# Patient Record
Sex: Male | Born: 1965 | Race: Black or African American | Hispanic: No | Marital: Single | Smoking: Current every day smoker
Health system: Southern US, Community
[De-identification: ages and names within clinical notes are randomized; demographics above are authoritative.]

## PROBLEM LIST (undated history)

## (undated) DIAGNOSIS — G8929 Other chronic pain: Secondary | ICD-10-CM

## (undated) DIAGNOSIS — J45909 Unspecified asthma, uncomplicated: Secondary | ICD-10-CM

## (undated) DIAGNOSIS — M549 Dorsalgia, unspecified: Secondary | ICD-10-CM

## (undated) DIAGNOSIS — I10 Essential (primary) hypertension: Secondary | ICD-10-CM

## (undated) DIAGNOSIS — T7840XA Allergy, unspecified, initial encounter: Secondary | ICD-10-CM

## (undated) HISTORY — PX: HAND SURGERY: SHX662

## (undated) HISTORY — DX: Unspecified asthma, uncomplicated: J45.909

## (undated) HISTORY — DX: Allergy, unspecified, initial encounter: T78.40XA

---

## 2001-02-20 ENCOUNTER — Emergency Department (HOSPITAL_COMMUNITY): Admission: EM | Admit: 2001-02-20 | Discharge: 2001-02-20 | Payer: Self-pay | Admitting: *Deleted

## 2001-04-23 ENCOUNTER — Emergency Department (HOSPITAL_COMMUNITY): Admission: EM | Admit: 2001-04-23 | Discharge: 2001-04-23 | Payer: Self-pay | Admitting: Emergency Medicine

## 2001-04-23 ENCOUNTER — Encounter: Payer: Self-pay | Admitting: Emergency Medicine

## 2001-05-23 ENCOUNTER — Emergency Department (HOSPITAL_COMMUNITY): Admission: EM | Admit: 2001-05-23 | Discharge: 2001-05-23 | Payer: Self-pay | Admitting: Emergency Medicine

## 2001-05-23 ENCOUNTER — Encounter: Payer: Self-pay | Admitting: Emergency Medicine

## 2003-11-17 ENCOUNTER — Emergency Department (HOSPITAL_COMMUNITY): Admission: EM | Admit: 2003-11-17 | Discharge: 2003-11-17 | Payer: Self-pay | Admitting: Emergency Medicine

## 2005-03-23 ENCOUNTER — Emergency Department (HOSPITAL_COMMUNITY): Admission: EM | Admit: 2005-03-23 | Discharge: 2005-03-23 | Payer: Self-pay | Admitting: Emergency Medicine

## 2007-03-03 ENCOUNTER — Emergency Department (HOSPITAL_COMMUNITY): Admission: EM | Admit: 2007-03-03 | Discharge: 2007-03-03 | Payer: Self-pay | Admitting: Emergency Medicine

## 2007-04-02 ENCOUNTER — Emergency Department (HOSPITAL_COMMUNITY): Admission: EM | Admit: 2007-04-02 | Discharge: 2007-04-02 | Payer: Self-pay | Admitting: Emergency Medicine

## 2007-08-31 ENCOUNTER — Emergency Department (HOSPITAL_COMMUNITY): Admission: EM | Admit: 2007-08-31 | Discharge: 2007-08-31 | Payer: Self-pay | Admitting: Emergency Medicine

## 2010-08-13 ENCOUNTER — Emergency Department (HOSPITAL_COMMUNITY)
Admission: EM | Admit: 2010-08-13 | Discharge: 2010-08-13 | Payer: Self-pay | Source: Home / Self Care | Admitting: Emergency Medicine

## 2010-11-16 ENCOUNTER — Emergency Department (HOSPITAL_COMMUNITY)
Admission: EM | Admit: 2010-11-16 | Discharge: 2010-11-16 | Disposition: A | Discharge status: Home / Self Care | Payer: Self-pay | Attending: Emergency Medicine | Admitting: Emergency Medicine

## 2010-11-16 DIAGNOSIS — I10 Essential (primary) hypertension: Secondary | ICD-10-CM | POA: Insufficient documentation

## 2010-11-16 DIAGNOSIS — M549 Dorsalgia, unspecified: Secondary | ICD-10-CM | POA: Insufficient documentation

## 2010-11-16 DIAGNOSIS — Z79899 Other long term (current) drug therapy: Secondary | ICD-10-CM | POA: Insufficient documentation

## 2010-11-16 DIAGNOSIS — M5380 Other specified dorsopathies, site unspecified: Secondary | ICD-10-CM | POA: Insufficient documentation

## 2010-11-16 DIAGNOSIS — G8929 Other chronic pain: Secondary | ICD-10-CM | POA: Insufficient documentation

## 2010-11-16 LAB — URINALYSIS, ROUTINE W REFLEX MICROSCOPIC
Bilirubin Urine: NEGATIVE
Glucose, UA: NEGATIVE mg/dL
Hgb urine dipstick: NEGATIVE
Ketones, ur: NEGATIVE mg/dL
Nitrite: NEGATIVE
Protein, ur: NEGATIVE mg/dL
Specific Gravity, Urine: 1.025 (ref 1.005–1.030)
Urobilinogen, UA: 0.2 mg/dL (ref 0.0–1.0)
pH: 5.5 (ref 5.0–8.0)

## 2011-06-09 ENCOUNTER — Emergency Department (HOSPITAL_COMMUNITY): Payer: Self-pay

## 2011-06-09 ENCOUNTER — Emergency Department (HOSPITAL_COMMUNITY)
Admission: EM | Admit: 2011-06-09 | Discharge: 2011-06-09 | Disposition: A | Discharge status: Home / Self Care | Payer: Self-pay | Attending: Emergency Medicine | Admitting: Emergency Medicine

## 2011-06-09 ENCOUNTER — Encounter: Payer: Self-pay | Admitting: Emergency Medicine

## 2011-06-09 DIAGNOSIS — F17200 Nicotine dependence, unspecified, uncomplicated: Secondary | ICD-10-CM | POA: Insufficient documentation

## 2011-06-09 DIAGNOSIS — R109 Unspecified abdominal pain: Secondary | ICD-10-CM | POA: Insufficient documentation

## 2011-06-09 DIAGNOSIS — R1030 Lower abdominal pain, unspecified: Secondary | ICD-10-CM

## 2011-06-09 DIAGNOSIS — I10 Essential (primary) hypertension: Secondary | ICD-10-CM | POA: Insufficient documentation

## 2011-06-09 DIAGNOSIS — R062 Wheezing: Secondary | ICD-10-CM | POA: Insufficient documentation

## 2011-06-09 HISTORY — DX: Essential (primary) hypertension: I10

## 2011-06-09 LAB — URINALYSIS, ROUTINE W REFLEX MICROSCOPIC
Bilirubin Urine: NEGATIVE
Glucose, UA: NEGATIVE mg/dL
Hgb urine dipstick: NEGATIVE
Ketones, ur: NEGATIVE mg/dL
Leukocytes, UA: NEGATIVE
Nitrite: NEGATIVE
Protein, ur: NEGATIVE mg/dL
Specific Gravity, Urine: <1.005 — ABNORMAL LOW (ref 1.005–1.030)
Urobilinogen, UA: 0.2 mg/dL (ref 0.0–1.0)
pH: 6 (ref 5.0–8.0)

## 2011-06-09 MED ORDER — NAPROXEN 500 MG PO TABS: 500.0000 mg | ORAL_TABLET | Freq: Two times a day (BID) | ORAL | Status: DC

## 2011-06-09 MED ORDER — OXYCODONE-ACETAMINOPHEN 5-325 MG PO TABS
1.0000 | ORAL_TABLET | Freq: Once | ORAL | Status: AC
  Administered 2011-06-09: 1 via ORAL
  Filled 2011-06-09: qty 1

## 2011-06-09 MED ORDER — HYDROCODONE-ACETAMINOPHEN 5-325 MG PO TABS: 1.0000 | ORAL_TABLET | Freq: Four times a day (QID) | ORAL | Status: AC | PRN

## 2011-06-09 NOTE — ED Notes (Signed)
Pt c/o left flank pain x 3 days 

## 2011-06-09 NOTE — ED Notes (Signed)
Pt reporting pain beginning in left lower back and radiating to left groin.  Denies nausea, vomiting, fever or any other complaints.  No acute distress noted.

## 2011-06-09 NOTE — ED Provider Notes (Signed)
History    Scribed for Benny Lennert, MD, the patient was seen in room APA11/APA11. This chart was scribed by Katha Cabal. This patient's care was started at 18:47.   CSN: [id number] Arrival date & time: 06/09/2011  6:04 PM  Chief Complaint  Patient presents with  . Flank Pain    (Consider location/radiation/quality/duration/timing/severity/associated sxs/prior treatment) HPI  Mike Keller is a 45 y.o. male who presents to the Emergency Department complaining of gradual worsening of left flank pain with associated left groin pain and ongoing back pain that began 3 days ago.  Denies n/v/d, fever, chills, injury, dysuria, and smoking.  Pain is worsened by movement and standing upright.  Patient frequently lifts heavy objects at work.  Patient has hx of back pain and HTN.     PAST MEDICAL HISTORY:  Past Medical History  Diagnosis Date  . Hypertension     PAST SURGICAL HISTORY:  History reviewed. No pertinent past surgical history.  FAMILY HISTORY:  History reviewed. No pertinent family history.   SOCIAL HISTORY: History   Social History  . Marital Status: Single    Spouse Name: N/A    Number of Children: N/A  . Years of Education: N/A   Social History Main Topics  . Smoking status: Current Everyday Smoker    Types: Cigarettes  . Smokeless tobacco: None  . Alcohol Use: Yes  . Drug Use: No  . Sexually Active:    Other Topics Concern  . None   Social History Narrative  . None    Review of Systems  Constitutional: Negative for fever, chills and fatigue.  HENT: Negative for congestion, sinus pressure and ear discharge.   Eyes: Negative for discharge.  Respiratory: Negative for cough.   Cardiovascular: Negative for chest pain.  Gastrointestinal: Negative for nausea, vomiting, abdominal pain and diarrhea.  Genitourinary: Positive for flank pain (left). Negative for dysuria, frequency and hematuria.  Musculoskeletal: Positive for back pain.  Skin: Negative  for rash.  Neurological: Negative for seizures and headaches.  Hematological: Negative.   Psychiatric/Behavioral: Negative for hallucinations.    Allergies  Review of patient's allergies indicates no known allergies.  Home Medications   Current Outpatient Rx  Name Route Sig Dispense Refill  . IBUPROFEN 200 MG PO TABS Oral Take 400 mg by mouth as needed. For pain       BP 152/103  Pulse 73  Temp(Src) 99.4 F (37.4 C) (Oral)  Resp 16  Ht 5\' 7"  (1.702 m)  Wt 145 lb (65.772 kg)  BMI 22.71 kg/m2  SpO2 99%  Physical Exam  Constitutional: He is oriented to person, place, and time. He appears well-developed. No distress.  HENT:  Head: Normocephalic and atraumatic.  Eyes: Conjunctivae and EOM are normal. No scleral icterus.  Neck: Neck supple. No thyromegaly present.  Cardiovascular: Normal rate and regular rhythm.  Exam reveals no gallop and no friction rub.   No murmur heard. Pulmonary/Chest: Effort normal. No stridor. He has wheezes. He has no rales. He exhibits no tenderness.       Minimal wheezes  Abdominal: Soft. He exhibits no distension and no mass. There is no rebound.       Left inguinal tenderness   Musculoskeletal: Normal range of motion. He exhibits no edema.  Lymphadenopathy:    He has no cervical adenopathy.  Neurological: He is oriented to person, place, and time. Coordination normal.  Skin: No rash noted. No erythema.  Psychiatric: He has a normal mood and affect. His  behavior is normal.    ED Course  Procedures (including critical care time)  Labs Reviewed  URINALYSIS, ROUTINE W REFLEX MICROSCOPIC - Abnormal; Notable for the following:    Specific Gravity, Urine <1.005 (*)    All other components within normal limits   OTHER DATA REVIEWED: Nursing notes, vital signs, and past medical records reviewed.   DIAGNOSTIC STUDIES: Oxygen Saturation is 99% on room air, normal by my interpretation.       LABS / RADIOLOGY:  Results for orders placed  during the hospital encounter of 06/09/11  URINALYSIS, ROUTINE W REFLEX MICROSCOPIC      Component Value Range   Color, Urine YELLOW  YELLOW    Appearance CLEAR  CLEAR    Specific Gravity, Urine <1.005 (*) 1.005 - 1.030    pH 6.0  5.0 - 8.0    Glucose, UA NEGATIVE  NEGATIVE (mg/dL)   Hgb urine dipstick NEGATIVE  NEGATIVE    Bilirubin Urine NEGATIVE  NEGATIVE    Ketones, ur NEGATIVE  NEGATIVE (mg/dL)   Protein, ur NEGATIVE  NEGATIVE (mg/dL)   Urobilinogen, UA 0.2  0.0 - 1.0 (mg/dL)   Nitrite NEGATIVE  NEGATIVE    Leukocytes, UA NEGATIVE  NEGATIVE    Dg Hip Complete Left  06/09/2011  *RADIOLOGY REPORT*  Clinical Data: Left hip pain for 4 days.  LEFT HIP - COMPLETE 2+ VIEW  Comparison: None.  Findings: Hip joint spaces are preserved.  The right hip is externally rotated.  Pelvic rings appear intact.  There is no fracture.  No AVN.  SI joints appear within normal limits.  IMPRESSION: Negative.  Original Report Authenticated By: Andreas Newport, M.D.       ED COURSE / COORDINATION OF CARE: 6:56 PM  Physical exam complete.  Will XR left hip.   8:26 PM  XR normal plan to discharge patient home.     Orders Placed This Encounter  Procedures  . DG Hip Complete Left  . Urinalysis, Routine w reflex microscopic         MDM   Groin pain,  2nd to inflamed quad insertion   IMPRESSION: Diagnoses that have been ruled out:  Diagnoses that are still under consideration:  Final diagnoses:     MEDICATIONS GIVEN IN THE E.D. Scheduled Meds:    . oxyCODONE-acetaminophen  1 tablet Oral Once   Continuous Infusions:     DISCHARGE MEDICATIONS: New Prescriptions   No medications on file     The chart was scribed for me under my direct supervision.  I personally performed the history, physical, and medical decision making and all procedures in the evaluation of this patient.Benny Lennert, MD 06/09/11 2036

## 2011-11-03 ENCOUNTER — Emergency Department (HOSPITAL_COMMUNITY)
Admission: EM | Admit: 2011-11-03 | Discharge: 2011-11-03 | Disposition: A | Discharge status: Home / Self Care | Payer: Self-pay | Attending: Emergency Medicine | Admitting: Emergency Medicine

## 2011-11-03 ENCOUNTER — Encounter (HOSPITAL_COMMUNITY): Payer: Self-pay | Admitting: *Deleted

## 2011-11-03 DIAGNOSIS — H5710 Ocular pain, unspecified eye: Secondary | ICD-10-CM | POA: Insufficient documentation

## 2011-11-03 DIAGNOSIS — W208XXA Other cause of strike by thrown, projected or falling object, initial encounter: Secondary | ICD-10-CM | POA: Insufficient documentation

## 2011-11-03 DIAGNOSIS — T1590XA Foreign body on external eye, part unspecified, unspecified eye, initial encounter: Secondary | ICD-10-CM | POA: Insufficient documentation

## 2011-11-03 MED ORDER — FLUORESCEIN SODIUM 1 MG OP STRP
ORAL_STRIP | OPHTHALMIC | Status: AC
  Administered 2011-11-03: 15:00:00
  Filled 2011-11-03: qty 1

## 2011-11-03 MED ORDER — TOBRAMYCIN 0.3 % OP OINT
TOPICAL_OINTMENT | Freq: Once | OPHTHALMIC | Status: AC
  Administered 2011-11-03: 16:00:00 via OPHTHALMIC
  Filled 2011-11-03: qty 3.5

## 2011-11-03 MED ORDER — TETRACAINE HCL 0.5 % OP SOLN
2.0000 [drp] | Freq: Once | OPHTHALMIC | Status: AC
  Administered 2011-11-03: 2 [drp] via OPHTHALMIC
  Filled 2011-11-03: qty 2

## 2011-11-03 NOTE — ED Notes (Signed)
Pt c/o swelling to his left eye since yesterday. States that he was cleaning a ceiling fan and a piece of dirt got in it.

## 2011-11-07 NOTE — ED Provider Notes (Signed)
History     CSN: [id number]  Arrival date & time 11/03/11  1209   First MD Initiated Contact with Patient 11/03/11 1259      Chief Complaint  Patient presents with  . Eye Pain    (Consider location/radiation/quality/duration/timing/severity/associated sxs/prior treatment) HPI Comments: Patient reports he had a piece of ceiling plaster fall in his left eye yesterday while cleaning a ceiling fan.  He has persistent feeling of foreign body in the eye.  He denies visual changes.  Patient is a 46 y.o. male presenting with eye pain. The history is provided by the patient.  Eye Pain This is a new problem. The current episode started yesterday. The problem occurs constantly. Pertinent negatives include no abdominal pain, arthralgias, chest pain, congestion, fever, headaches, joint swelling, nausea, neck pain, numbness, rash, sore throat, visual change or weakness. Exacerbated by: Blinking worsens the pain.  He has been rubbing his eye alot and woke with swelling of his upper eye lid today. He has tried nothing for the symptoms.    Past Medical History  Diagnosis Date  . Hypertension     Past Surgical History  Procedure Date  . Hand surgery     left    History reviewed. No pertinent family history.  History  Substance Use Topics  . Smoking status: Current Everyday Smoker    Types: Cigarettes  . Smokeless tobacco: Not on file  . Alcohol Use: Yes      Review of Systems  Constitutional: Negative for fever.  HENT: Negative for congestion, sore throat and neck pain.   Eyes: Positive for pain and redness. Negative for visual disturbance.  Respiratory: Negative for chest tightness and shortness of breath.   Cardiovascular: Negative for chest pain.  Gastrointestinal: Negative for nausea and abdominal pain.  Genitourinary: Negative.   Musculoskeletal: Negative for joint swelling and arthralgias.  Skin: Negative.  Negative for rash and wound.  Neurological: Negative for dizziness,  weakness, light-headedness, numbness and headaches.  Hematological: Negative.   Psychiatric/Behavioral: Negative.     Allergies  Review of patient's allergies indicates no known allergies.  Home Medications  No current outpatient prescriptions on file.  BP 140/91  Pulse 92  Temp(Src) 98.2 F (36.8 C) (Oral)  Resp 18  Ht 5\' 7"  (1.702 m)  Wt 137 lb (62.143 kg)  BMI 21.46 kg/m2  SpO2 100%  Physical Exam  Nursing note and vitals reviewed. Constitutional: He is oriented to person, place, and time. He appears well-developed and well-nourished.  HENT:  Head: Normocephalic and atraumatic.  Eyes: Pupils are equal, round, and reactive to light. Left eye exhibits no chemosis, no discharge and no exudate. No foreign body present in the left eye. Left conjunctiva is injected. Left conjunctiva has no hemorrhage. Left eye exhibits normal extraocular motion and no nystagmus.  Slit lamp exam:      The left eye shows no corneal abrasion, no corneal flare, no corneal ulcer, no foreign body, no hyphema and no fluorescein uptake.       Os 20/30  od 20/20  Neck: Normal range of motion.  Cardiovascular: Normal rate.   Pulmonary/Chest: Effort normal and breath sounds normal.  Abdominal: He exhibits no distension.  Musculoskeletal: Normal range of motion.  Neurological: He is alert and oriented to person, place, and time.  Skin: Skin is warm and dry.  Psychiatric: He has a normal mood and affect.    ED Course  Procedures (including critical care time)  Labs Reviewed - No data to display  No results found.   1. Eye foreign body     Patients left eye was treated with tetracaine,  Then fluorescein test strip applied - no corneal abrasion identified. Eyelids everted with no visible foreign body. Complete resolution of discomfort with tetracaine application.  Eye flushed with NS.  Patient observed until tetracaine wore off and no further foreign body sensation appreciated.    MDM  Patient given  tobrex ointment for comfort and for treatment of conjunctivitis (mechanica).        Candis Musa, PA 11/07/11 2105

## 2011-11-08 NOTE — ED Provider Notes (Signed)
Medical screening examination/treatment/procedure(s) were performed by non-physician practitioner and as supervising physician I was immediately available for consultation/collaboration.  Nicoletta Dress. Colon Branch, MD 11/08/11 [phone]

## 2011-12-17 ENCOUNTER — Other Ambulatory Visit: Payer: Self-pay

## 2011-12-17 ENCOUNTER — Emergency Department (HOSPITAL_COMMUNITY): Payer: Self-pay

## 2011-12-17 ENCOUNTER — Emergency Department (HOSPITAL_COMMUNITY)
Admission: EM | Admit: 2011-12-17 | Discharge: 2011-12-17 | Disposition: A | Discharge status: Home / Self Care | Payer: Self-pay | Attending: Emergency Medicine | Admitting: Emergency Medicine

## 2011-12-17 ENCOUNTER — Encounter (HOSPITAL_COMMUNITY): Payer: Self-pay | Admitting: Emergency Medicine

## 2011-12-17 DIAGNOSIS — R0789 Other chest pain: Secondary | ICD-10-CM | POA: Insufficient documentation

## 2011-12-17 DIAGNOSIS — M549 Dorsalgia, unspecified: Secondary | ICD-10-CM | POA: Insufficient documentation

## 2011-12-17 DIAGNOSIS — F17200 Nicotine dependence, unspecified, uncomplicated: Secondary | ICD-10-CM | POA: Insufficient documentation

## 2011-12-17 LAB — TROPONIN I: Troponin I: <0.3 ng/mL (ref <0.30)

## 2011-12-17 NOTE — ED Notes (Signed)
Pt placed on zoll for cardiac monitoring. NSR noted on monitor. Pt denies SOB and N/V.

## 2011-12-17 NOTE — ED Notes (Signed)
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 Pt states it hurts to take a deep breath.

## 2011-12-17 NOTE — Discharge Instructions (Signed)
Take ibuprofen for your pain. If your symptoms worsen, you develop shortness of breath, increased pain, nausea and vomiting or other problems return here.  Chest Wall Pain Chest wall pain is pain in or around the bones and muscles of your chest. It may take up to 6 weeks to get better. It may take longer if you must stay physically active in your work and activities.  CAUSES  Chest wall pain may happen on its own. However, it may be caused by:  A viral illness like the flu.   Injury.   Coughing.   Exercise.   Arthritis.   Fibromyalgia.   Shingles.  HOME CARE INSTRUCTIONS   Avoid overtiring physical activity. Try not to strain or perform activities that cause pain. This includes any activities using your chest or your abdominal and side muscles, especially if heavy weights are used.   Put ice on the sore area.   Put ice in a plastic bag.   Place a towel between your skin and the bag.   Leave the ice on for 15 to 20 minutes per hour while awake for the first 2 days.   Only take over-the-counter or prescription medicines for pain, discomfort, or fever as directed by your caregiver.  SEEK IMMEDIATE MEDICAL CARE IF:   Your pain increases, or you are very uncomfortable.   You have a fever.   Your chest pain becomes worse.   You have new, unexplained symptoms.   You have nausea or vomiting.   You feel sweaty or lightheaded.   You have a cough with phlegm (sputum), or you cough up blood.  MAKE SURE YOU:   Understand these instructions.   Will watch your condition.   Will get help right away if you are not doing well or get worse.  Document Released: 08/23/2005 Document Revised: 08/12/2011 Document Reviewed: 04/19/2011 [facility] Patient Information 2012 [place], [place].

## 2011-12-17 NOTE — ED Provider Notes (Signed)
History     CSN: [id number]  Arrival date & time 12/17/11  1419   First MD Initiated Contact with Patient 12/17/11 1533      Chief Complaint  Patient presents with  . Chest Pain    rib pain   HPI Mike Keller is a 46 y.o. male who presents to the emergency department with chest pain. The pain started today at work approximately 1:30 pm. 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 The patient bent down and tried to lift the resident and started having chest pain. He describes the pain as sharp and constant since the incident. Some shortness of breath. Called his mother to bring him to the ED.  Denies any dizziness or syncope. Pain increases with deep breath and movement. The history was provided by the patient.   History reviewed. No pertinent past medical history.  Past Surgical History  Procedure Date  . Hand surgery     left    History reviewed. No pertinent family history.  History  Substance Use Topics  . Smoking status: Current Everyday Smoker    Types: Cigarettes  . Smokeless tobacco: Not on file  . Alcohol Use: Yes      Review of Systems  Constitutional: Negative for fever, chills, diaphoresis and fatigue.  HENT: Negative for ear pain, congestion, sore throat, facial swelling, neck pain, neck stiffness, dental problem and sinus pressure.   Eyes: Negative for photophobia, pain and discharge.  Respiratory: Negative for cough, chest tightness and wheezing.   Cardiovascular: Positive for chest pain. Negative for palpitations and leg swelling.  Gastrointestinal: Negative for nausea, vomiting, abdominal pain, diarrhea, constipation and abdominal distention.  Genitourinary: Negative for dysuria, urgency, frequency, flank pain and difficulty urinating.  Musculoskeletal: Positive for back pain. Negative for myalgias and gait problem.  Skin: Negative for color change and rash.  Neurological: Negative for dizziness, syncope, speech  difficulty, weakness, light-headedness, numbness and headaches.  Psychiatric/Behavioral: Negative for confusion and agitation. The patient is not nervous/anxious.     Allergies  Review of patient's allergies indicates no known allergies.  Home Medications  No current outpatient prescriptions on file.  BP 153/87  Pulse 97  Temp(Src) 98.5 F (36.9 C) (Oral)  Resp 20  Ht 5\' 7"  (1.702 m)  Wt 132 lb (59.875 kg)  BMI 20.67 kg/m2  SpO2 98%  Physical Exam  Nursing note and vitals reviewed. Constitutional: He is oriented to person, place, and time. He appears well-developed and well-nourished. No distress.  HENT:  Head: Normocephalic and atraumatic.  Eyes: Conjunctivae and EOM are normal. Pupils are equal, round, and reactive to light.  Neck: Normal range of motion. Neck supple.  Cardiovascular: Normal rate and regular rhythm.   Pulmonary/Chest: Effort normal and breath sounds normal. He exhibits tenderness.         Tender left chest wall and left upper back with palpation and deep breath.  Abdominal: Soft. Bowel sounds are normal. There is no tenderness.  Musculoskeletal: Normal range of motion.  Neurological: He is alert and oriented to person, place, and time. No cranial nerve deficit.  Skin: Skin is warm and dry.  Psychiatric: He has a normal mood and affect. His behavior is normal. Judgment and thought content normal.     ED Course: discussed with Dr. Deretha Emory  Procedures   Dg Chest 2 View  12/17/2011  *RADIOLOGY REPORT*  Clinical Data: Chest pain.  CHEST - 2 VIEW  Comparison: None.  Findings: Heart and mediastinal contours are within normal limits. No focal opacities or effusions.  No acute bony abnormality.  IMPRESSION: No active cardiopulmonary disease.  Original Report Authenticated By: Cyndie Chime, M.D.   Results for orders placed during the hospital encounter of 12/17/11 (from the past 24 hour(s))  TROPONIN I     Status: Normal   Collection Time   12/17/11  4:08  PM      Component Value Range   Troponin I <0.30  <0.30 (ng/mL)   Assessment: Chest wall pain   Back pain  Plan:  Ibuprofen prn   Follow up with PCP   Return if symptoms worsen  MDM          Janne Napoleon, NP 12/17/11 1726

## 2011-12-18 NOTE — ED Provider Notes (Signed)
Medical screening examination/treatment/procedure(s) were performed by non-physician practitioner and as supervising physician I was immediately available for consultation/collaboration.  History cw chest wall pain. EKG without acute changes. CXR negative for pneumothorax.     Shelda Jakes, MD 12/18/11 1149

## 2012-07-06 ENCOUNTER — Emergency Department (HOSPITAL_COMMUNITY)
Admission: EM | Admit: 2012-07-06 | Discharge: 2012-07-06 | Disposition: A | Discharge status: Home / Self Care | Payer: Self-pay | Attending: Emergency Medicine | Admitting: Emergency Medicine

## 2012-07-06 ENCOUNTER — Encounter (HOSPITAL_COMMUNITY): Payer: Self-pay | Admitting: *Deleted

## 2012-07-06 DIAGNOSIS — Z79899 Other long term (current) drug therapy: Secondary | ICD-10-CM | POA: Insufficient documentation

## 2012-07-06 DIAGNOSIS — M545 Low back pain, unspecified: Secondary | ICD-10-CM | POA: Insufficient documentation

## 2012-07-06 DIAGNOSIS — I10 Essential (primary) hypertension: Secondary | ICD-10-CM | POA: Insufficient documentation

## 2012-07-06 DIAGNOSIS — M549 Dorsalgia, unspecified: Secondary | ICD-10-CM

## 2012-07-06 DIAGNOSIS — F17200 Nicotine dependence, unspecified, uncomplicated: Secondary | ICD-10-CM | POA: Insufficient documentation

## 2012-07-06 DIAGNOSIS — G8929 Other chronic pain: Secondary | ICD-10-CM | POA: Insufficient documentation

## 2012-07-06 DIAGNOSIS — R51 Headache: Secondary | ICD-10-CM | POA: Insufficient documentation

## 2012-07-06 HISTORY — DX: Dorsalgia, unspecified: M54.9

## 2012-07-06 HISTORY — DX: Other chronic pain: G89.29

## 2012-07-06 MED ORDER — CYCLOBENZAPRINE HCL 10 MG PO TABS: 10.0000 mg | ORAL_TABLET | Freq: Two times a day (BID) | ORAL | Status: DC | PRN

## 2012-07-06 MED ORDER — NAPROXEN 500 MG PO TABS: 500.0000 mg | ORAL_TABLET | Freq: Two times a day (BID) | ORAL | Status: DC

## 2012-07-06 MED ORDER — HYDROCODONE-ACETAMINOPHEN 5-325 MG PO TABS: 1.0000 | ORAL_TABLET | Freq: Four times a day (QID) | ORAL | Status: AC | PRN

## 2012-07-06 NOTE — ED Notes (Signed)
Pt states chronic lower back pain with pain radiating down left leg. Pain began last night. No known new injury.

## 2012-07-06 NOTE — ED Provider Notes (Signed)
History  This chart was scribed for Shelda Jakes, MD by Bennett Scrape. This patient was seen in room APA01/APA01 and the patient's care was started at 8:57 AM.  CSN: [id number]  Arrival date & time 07/06/12  0850   First MD Initiated Contact with Patient 07/06/12 [phone]      Chief Complaint  Patient presents with  . Back Pain     Patient is a 46 y.o. male presenting with back pain. The history is provided by the patient. No language interpreter was used.  Back Pain  This is a chronic problem. The current episode started yesterday. The problem occurs constantly. The problem has been gradually worsening. The pain is associated with no known injury. The pain is present in the lumbar spine. The pain radiates to the left thigh, left knee and left foot. Associated symptoms include headaches. Pertinent negatives include no chest pain, no fever, no abdominal pain and no dysuria.    Mike Keller is a 47 y.o. male with a h/o chronic back pain who presents to the Emergency Department complaining of 7 hours of gradual onset, gradually worsening, constant bilateral lower back pain described as throbbing that radiates shooting pains down his anterior left leg to his foot. He denies any recent injuries and reports that he has been taking Advil and Aleve, which is his usual at home treatment for the pain, with improvement in his symptoms. He states that his pain is chronic with his last episode of similar back pain was 4 months ago. He also c/o 2 days of intermittent mild HA attributed to his h/o HTN but he denies being on medications to control the HTN. BP is 162/90 in the ED. He denies CP, abdominal pain, visual changes, fever, nausea, emesis, weakness and numbness in his left foot as associated symptoms. He is a current everyday smoker and occasional alcohol user.  Past Medical History  Diagnosis Date  . Chronic back pain     Past Surgical History  Procedure Date  . Hand surgery     left  .  Hand surgery     No family history on file.  History  Substance Use Topics  . Smoking status: Current Every Day Smoker    Types: Cigarettes  . Smokeless tobacco: Not on file  . Alcohol Use: Yes      Review of Systems  Constitutional: Negative for fever and chills.  HENT: Negative for congestion and rhinorrhea.   Eyes: Negative for visual disturbance.  Respiratory: Negative for cough and shortness of breath.   Cardiovascular: Negative for chest pain.  Gastrointestinal: Negative for nausea, vomiting, abdominal pain and diarrhea.  Genitourinary: Negative for dysuria and frequency.  Musculoskeletal: Positive for back pain.  Skin: Negative for rash.  Neurological: Positive for headaches. Negative for seizures.  All other systems reviewed and are negative.    Allergies  Review of patient's allergies indicates no known allergies.  Home Medications   Current Outpatient Rx  Name Route Sig Dispense Refill  . CYCLOBENZAPRINE HCL 10 MG PO TABS Oral Take 1 tablet (10 mg total) by mouth 2 (two) times daily as needed for muscle spasms. 20 tablet 0  . HYDROCODONE-ACETAMINOPHEN 5-325 MG PO TABS Oral Take 1-2 tablets by mouth every 6 (six) hours as needed for pain. 10 tablet 0  . NAPROXEN 500 MG PO TABS Oral Take 1 tablet (500 mg total) by mouth 2 (two) times daily. 14 tablet 0    Triage Vitals: BP 162/90  Pulse  88  Temp 98.7 F (37.1 C) (Oral)  Resp 16  Ht 5\' 7"  (1.702 m)  Wt 137 lb (62.143 kg)  BMI 21.46 kg/m2  SpO2 100%  Physical Exam  Nursing note and vitals reviewed. Constitutional: He is oriented to person, place, and time. He appears well-developed and well-nourished. No distress.  HENT:  Head: Normocephalic and atraumatic.  Eyes: Conjunctivae normal and EOM are normal.  Neck: Normal range of motion. Neck supple. No tracheal deviation present.  Cardiovascular: Normal rate and regular rhythm.   No murmur heard. Pulmonary/Chest: Effort normal and breath sounds normal.  No respiratory distress.  Abdominal: Soft. Bowel sounds are normal. There is no tenderness.  Musculoskeletal: Normal range of motion. He exhibits tenderness. He exhibits no edema.       Tender in the lumbar spinous processes area and para lumbar muscles which is worse on the left, no muscle spasms noted  Neurological: He is alert and oriented to person, place, and time. No cranial nerve deficit.  Skin: Skin is warm and dry.  Psychiatric: He has a normal mood and affect. His behavior is normal.    ED Course  Procedures (including critical care time)  DIAGNOSTIC STUDIES: Oxygen Saturation is 100% on room air, normal by my interpretation.    COORDINATION OF CARE: 9:08 AM-Discussed treatment plan which includes naprosyn, Norco and Flexeril with pt at bedside and pt agreed to plan. Advised pt to follow up with his PCP.  9:10 AM- Prescribed 500 mg Naprosyn, 5-325 mg Norco and 10 mg Flexeril   Labs Reviewed - No data to display No results found.   1. Back pain       MDM   Hx of back problems. Recurrent acute left-sided low back pain with radiation into the anterior part of the leg no neuro focal deficits. No muscle spasm. Will treat with pain medicine anti-inflammatory medicine and muscle relaxer. No work note needed.      I personally performed the services described in this documentation, which was scribed in my presence. The recorded information has been reviewed and considered.     Shelda Jakes, MD 07/06/12 [phone]

## 2013-07-09 ENCOUNTER — Emergency Department (HOSPITAL_COMMUNITY)
Admission: EM | Admit: 2013-07-09 | Discharge: 2013-07-09 | Disposition: A | Discharge status: Home / Self Care | Payer: Self-pay | Attending: Emergency Medicine | Admitting: Emergency Medicine

## 2013-07-09 ENCOUNTER — Encounter (HOSPITAL_COMMUNITY): Payer: Self-pay | Admitting: Emergency Medicine

## 2013-07-09 ENCOUNTER — Emergency Department (HOSPITAL_COMMUNITY): Payer: Self-pay

## 2013-07-09 DIAGNOSIS — J029 Acute pharyngitis, unspecified: Secondary | ICD-10-CM | POA: Insufficient documentation

## 2013-07-09 DIAGNOSIS — J40 Bronchitis, not specified as acute or chronic: Secondary | ICD-10-CM

## 2013-07-09 DIAGNOSIS — R51 Headache: Secondary | ICD-10-CM | POA: Insufficient documentation

## 2013-07-09 DIAGNOSIS — M545 Low back pain, unspecified: Secondary | ICD-10-CM | POA: Insufficient documentation

## 2013-07-09 DIAGNOSIS — IMO0001 Reserved for inherently not codable concepts without codable children: Secondary | ICD-10-CM | POA: Insufficient documentation

## 2013-07-09 DIAGNOSIS — F17200 Nicotine dependence, unspecified, uncomplicated: Secondary | ICD-10-CM | POA: Insufficient documentation

## 2013-07-09 DIAGNOSIS — I10 Essential (primary) hypertension: Secondary | ICD-10-CM | POA: Insufficient documentation

## 2013-07-09 DIAGNOSIS — G8929 Other chronic pain: Secondary | ICD-10-CM | POA: Insufficient documentation

## 2013-07-09 DIAGNOSIS — J209 Acute bronchitis, unspecified: Secondary | ICD-10-CM | POA: Insufficient documentation

## 2013-07-09 DIAGNOSIS — R1110 Vomiting, unspecified: Secondary | ICD-10-CM | POA: Insufficient documentation

## 2013-07-09 DIAGNOSIS — R079 Chest pain, unspecified: Secondary | ICD-10-CM | POA: Insufficient documentation

## 2013-07-09 DIAGNOSIS — R4789 Other speech disturbances: Secondary | ICD-10-CM | POA: Insufficient documentation

## 2013-07-09 LAB — RAPID STREP SCREEN (MED CTR MEBANE ONLY): Streptococcus, Group A Screen (Direct): NEGATIVE

## 2013-07-09 MED ORDER — DM-GUAIFENESIN ER 30-600 MG PO TB12: 1.0000 | ORAL_TABLET | Freq: Two times a day (BID) | ORAL | Status: DC

## 2013-07-09 MED ORDER — AZITHROMYCIN 250 MG PO TABS: 250.0000 mg | ORAL_TABLET | Freq: Every day | ORAL | Status: DC

## 2013-07-09 MED ORDER — NAPROXEN 500 MG PO TABS: 500.0000 mg | ORAL_TABLET | Freq: Two times a day (BID) | ORAL | Status: DC

## 2013-07-09 NOTE — ED Provider Notes (Signed)
CSN: [id number]     Arrival date & time 07/09/13  1324 History  This chart was scribed for Shelda Jakes, MD by Bennett Scrape, ED Scribe. This patient was seen in room APA10/APA10 and the patient's care was started at 9:02 AM.   Chief Complaint  Patient presents with  . Cough  . Sore Throat    Patient is a 47 y.o. male presenting with pharyngitis. The history is provided by the patient. No language interpreter was used.  Sore Throat This is a new problem. The current episode started more than 1 week ago. The problem occurs constantly. The problem has been gradually worsening. Associated symptoms include chest pain (with cough), headaches (intermittent) and shortness of breath. Pertinent negatives include no abdominal pain. The symptoms are aggravated by swallowing. Nothing relieves the symptoms. He has tried nothing for the symptoms.    HPI Comments: Mike Keller is a 47 y.o. male who presents to the Emergency Department complaining of 2 weeks of a sore throat that became worse 2 days ago. He rates his pain a 9 out of 10 currently and states that it is was with swallowing. He lists cough productive of phlegm, CP and back with cough only, mild SOB, post-tussive emesis, nasal congestion, myaglias and intermittent HAs as associated symptoms. He denies having a h/o strep throat. He reports that his mother is sick with similar symptoms at home. He has a h/o HTN and is noncompliant with medications. Bp is 163/105 in the ED.  No PCP currently.   Past Medical History  Diagnosis Date  . Chronic back pain   . Hypertension    Past Surgical History  Procedure Laterality Date  . Hand surgery      left  . Hand surgery     History reviewed. No pertinent family history. History  Substance Use Topics  . Smoking status: Current Every Day Smoker    Types: Cigarettes  . Smokeless tobacco: Not on file  . Alcohol Use: Yes     Comment: beer, 2 cans daily    Review of Systems   Constitutional: Negative for fever and chills.  HENT: Positive for congestion and sore throat. Negative for nosebleeds.   Eyes: Negative for visual disturbance.  Respiratory: Positive for cough and shortness of breath.   Cardiovascular: Positive for chest pain (with cough). Negative for leg swelling.  Gastrointestinal: Positive for vomiting. Negative for nausea, abdominal pain and diarrhea.  Genitourinary: Negative for dysuria.  Musculoskeletal: Positive for back pain (with coughing, also has chronic lower back pain) and myalgias. Negative for neck pain.  Skin: Negative for rash.  Neurological: Positive for headaches (intermittent).  Hematological: Does not bruise/bleed easily.  Psychiatric/Behavioral: Negative for confusion.    Allergies  Review of patient's allergies indicates no known allergies.  Home Medications   Current Outpatient Rx  Name  Route  Sig  Dispense  Refill  . DM-Doxylamine-Acetaminophen (NYQUIL COLD & FLU PO)   Oral   Take 2 capsules by mouth every 6 (six) hours as needed. Cold/flu         . guaiFENesin-dextromethorphan (ROBITUSSIN DM) 100-10 MG/5ML syrup   Oral   Take 10 mLs by mouth every 4 (four) hours as needed for cough.         Marland Kitchen azithromycin (ZITHROMAX) 250 MG tablet   Oral   Take 1 tablet (250 mg total) by mouth daily. Take first 2 tablets together, then 1 every day until finished.   6 tablet   0   .  dextromethorphan-guaiFENesin (MUCINEX DM) 30-600 MG per 12 hr tablet   Oral   Take 1 tablet by mouth every 12 (twelve) hours.   14 tablet   0   . naproxen (NAPROSYN) 500 MG tablet   Oral   Take 1 tablet (500 mg total) by mouth 2 (two) times daily.   14 tablet   0    Triage Vitals: BP 163/105  Pulse 86  Temp(Src) 97.6 F (36.4 C) (Oral)  Resp 20  SpO2 98%  Physical Exam  Nursing note and vitals reviewed. Constitutional: He is oriented to person, place, and time. He appears well-developed and well-nourished. No distress.  Hoarse  voice  HENT:  Head: Normocephalic and atraumatic.  Mouth/Throat: Oropharynx is clear and moist. No oropharyngeal exudate.  Moderate amount of erythema to the oropharynx. Moist MM  Eyes: Conjunctivae and EOM are normal. Pupils are equal, round, and reactive to light.  Sclera are clear  Neck: Neck supple. No tracheal deviation present.  Cardiovascular: Normal rate and regular rhythm.   No murmur heard. Pulses:      Dorsalis pedis pulses are 2+ on the right side, and 2+ on the left side.  Pulmonary/Chest: Effort normal and breath sounds normal. No respiratory distress. He has no wheezes.  Abdominal: Soft. Bowel sounds are normal. He exhibits no distension. There is no tenderness.  Musculoskeletal: Normal range of motion. He exhibits no edema (no ankle swelling).  Lymphadenopathy:    He has no cervical adenopathy.  Neurological: He is alert and oriented to person, place, and time. No cranial nerve deficit.  Pt able to move both sets of fingers and toes  Skin: Skin is warm and dry. No rash noted.  Psychiatric: He has a normal mood and affect. His behavior is normal.    ED Course  Procedures (including critical care time)  DIAGNOSTIC STUDIES: Oxygen Saturation is 98% on room air, normal by my interpretation.    COORDINATION OF CARE: 9:05 AM-Discussed treatment plan which includes CXR and rapid strep screen with pt at bedside and pt agreed to plan. Informed pt of BP in the ED and advised to f/u for HTN medications. Will provide resources for pt to f/u with.  Labs Review Labs Reviewed  RAPID STREP SCREEN  CULTURE, GROUP A STREP   Results for orders placed during the hospital encounter of 07/09/13  RAPID STREP SCREEN      Result Value Range   Streptococcus, Group A Screen (Direct) NEGATIVE  NEGATIVE    Imaging Review Dg Chest 2 View  07/09/2013   CLINICAL DATA:  Initial encounter for cough and sore throat. Current history of hypertension.  EXAM: CHEST  2 VIEW  COMPARISON:   12/17/2011.  FINDINGS: Cardiomediastinal silhouette unremarkable and unchanged. Mildly prominent bronchovascular markings diffusely and mild to moderate central peribronchial thickening, similar to the prior examination. Lungs otherwise clear. No localized airspace consolidation. No pleural effusions. No pneumothorax. Normal pulmonary vascularity. Apparent hyperlucency at the left base is unchanged and is felt to represent asymmetry in the chest wall structures. Visualized bony thorax intact.  IMPRESSION: Mild changes of bronchitis and/or asthma, similar to the prior examination. No acute cardiopulmonary disease otherwise.   Electronically Signed   By: Hulan Saas M.D.   On: 07/09/2013 09:39    EKG Interpretation   None       MDM   1. Bronchitis   2. Pharyngitis    Chest xray negative for pneumonina suspect viral uri and bornchitis. No strep pharayngitis.  I personally performed the services described in this documentation, which was scribed in my presence. The recorded information has been reviewed and is accurate.     Shelda Jakes, MD 07/09/13 1040

## 2013-07-09 NOTE — ED Notes (Signed)
Pt c/o "cold" sx's x 2 weeks. Dry cough, sore throat and intermittent headaches. Trouble swallowing due to pain x 2 days. Still drinking fluids and soup. Swelling/redness and white patches noted to throat. Pt is hoarse."little" sob. No resp distress or sob noted. Nad.

## 2013-07-09 NOTE — ED Notes (Signed)
Patient with no complaints at this time. Respirations even and unlabored. Skin warm/dry. Discharge instructions reviewed with patient at this time. Patient given opportunity to voice concerns/ask questions. Patient discharged at this time and left Emergency Department with steady gait.   

## 2013-07-09 NOTE — Progress Notes (Signed)
ED/CM noted patient did not have health insurance and/or PCP listed in the computer.  Patient was given the [place] resource handout with information on the clinics, food pantries, and the handout for new health insurance sign-up.  Patient expressed appreciation for this. 

## 2013-07-12 LAB — CULTURE, GROUP A STREP

## 2015-06-06 ENCOUNTER — Emergency Department (HOSPITAL_COMMUNITY)
Admission: EM | Admit: 2015-06-06 | Discharge: 2015-06-06 | Disposition: A | Discharge status: Home / Self Care | Payer: Self-pay | Attending: Emergency Medicine | Admitting: Emergency Medicine

## 2015-06-06 ENCOUNTER — Encounter (HOSPITAL_COMMUNITY): Payer: Self-pay

## 2015-06-06 DIAGNOSIS — Z79899 Other long term (current) drug therapy: Secondary | ICD-10-CM | POA: Insufficient documentation

## 2015-06-06 DIAGNOSIS — Z720 Tobacco use: Secondary | ICD-10-CM | POA: Insufficient documentation

## 2015-06-06 DIAGNOSIS — G8929 Other chronic pain: Secondary | ICD-10-CM | POA: Insufficient documentation

## 2015-06-06 DIAGNOSIS — I10 Essential (primary) hypertension: Secondary | ICD-10-CM | POA: Insufficient documentation

## 2015-06-06 DIAGNOSIS — R040 Epistaxis: Secondary | ICD-10-CM | POA: Insufficient documentation

## 2015-06-06 MED ORDER — SILVER NITRATE-POT NITRATE 75-25 % EX MISC
CUTANEOUS | Status: AC
  Filled 2015-06-06: qty 1

## 2015-06-06 MED ORDER — OXYMETAZOLINE HCL 0.05 % NA SOLN
1.0000 | Freq: Once | NASAL | Status: AC
  Administered 2015-06-06: 1 via NASAL

## 2015-06-06 MED ORDER — SILVER NITRATE-POT NITRATE 75-25 % EX MISC: 1.0000 "application " | Freq: Once | CUTANEOUS | Status: DC

## 2015-06-06 MED ORDER — AMLODIPINE BESYLATE 5 MG PO TABS: 5.0000 mg | ORAL_TABLET | Freq: Every day | ORAL | Status: DC

## 2015-06-06 MED ORDER — OXYMETAZOLINE HCL 0.05 % NA SOLN
NASAL | Status: AC
  Administered 2015-06-06: 1 via NASAL
  Filled 2015-06-06: qty 15

## 2015-06-06 NOTE — ED Provider Notes (Signed)
CSN: [id number]     Arrival date & time 06/06/15  0557 History   First MD Initiated Contact with Patient 06/06/15 [phone]    Chief Complaint  Patient presents with  . Epistaxis     (Consider location/radiation/quality/duration/timing/severity/associated sxs/prior Treatment) HPI patient states he has started having bleeding from his left nostril 2 nights ago. He states it comes and goes. He states it will bleed for about 10 minutes and then stop. He denies having any cold symptoms, including sneezing, coughing, or fever. He states he used to has nosebleeds as a child but not as an adult. He denies taking any cold or cough medications over-the-counter. He denies snorting any type of recreational drugs. He does report a history of hypertension however he states he does not take medication because he cannot afford it. I have discussed with him that [facility] has several $4 blood pressure medications which is very affordable. He agrees he can afford that.   PCP none  Past Medical History  Diagnosis Date  . Chronic back pain   . Hypertension    Past Surgical History  Procedure Laterality Date  . Hand surgery      left  . Hand surgery     No family history on file. Social History  Substance Use Topics  . Smoking status: Current Every Day Smoker    Types: Cigarettes  . Smokeless tobacco: None  . Alcohol Use: Yes     Comment: beer, 2 cans daily  employed Smokes 1 ppd  Review of Systems  All other systems reviewed and are negative.     Allergies  Review of patient's allergies indicates no known allergies.  Home Medications   Prior to Admission medications   Medication Sig Start Date End Date Taking? Authorizing Provider  amLODipine (NORVASC) 5 MG tablet Take 1 tablet (5 mg total) by mouth daily. 06/06/15   Devoria Albe, MD  azithromycin (ZITHROMAX) 250 MG tablet Take 1 tablet (250 mg total) by mouth daily. Take first 2 tablets together, then 1 every day until finished. 07/09/13   Vanetta Mulders, MD  dextromethorphan-guaiFENesin (MUCINEX DM) 30-600 MG per 12 hr tablet Take 1 tablet by mouth every 12 (twelve) hours. 07/09/13   Vanetta Mulders, MD  DM-Doxylamine-Acetaminophen (NYQUIL COLD & FLU PO) Take 2 capsules by mouth every 6 (six) hours as needed. Cold/flu    Historical Provider, MD  guaiFENesin-dextromethorphan (ROBITUSSIN DM) 100-10 MG/5ML syrup Take 10 mLs by mouth every 4 (four) hours as needed for cough.    Historical Provider, MD  naproxen (NAPROSYN) 500 MG tablet Take 1 tablet (500 mg total) by mouth 2 (two) times daily. 07/09/13   Vanetta Mulders, MD   BP 145/103 mmHg  Pulse 80  Temp(Src) 98.4 F (36.9 C) (Oral)  Resp 18  Ht  (1.702 m)  Wt 137 lb (62.143 kg)  BMI 21.45 kg/m2  SpO2 99%  Vital signs normal except for hypertension  Physical Exam  Constitutional: He is oriented to person, place, and time. He appears well-developed and well-nourished. He appears distressed.  HENT:  Head: Normocephalic and atraumatic.  Right Ear: External ear normal.  Left Ear: External ear normal.  Patient is spitting up blood and has mild drainage from the left nares. When I inspect his nares it is for blood and I am unable to see the septum. Afrin soaked cotton ball was placed in his left nostril.  Eyes: Conjunctivae and EOM are normal.  Neck: Normal range of motion. Neck supple.  Pulmonary/Chest: Effort normal. No respiratory distress.  Musculoskeletal: Normal range of motion.  Neurological: He is alert and oriented to person, place, and time. No cranial nerve deficit.  Skin: Skin is warm and dry. No rash noted.  Psychiatric: He has a normal mood and affect. His behavior is normal.    ED Course  Procedures (including critical care time)  Medications  silver nitrate applicators applicator 1 application (not administered)  silver nitrate applicators 75-25 % applicator (not administered)  oxymetazoline (AFRIN) 0.05 % nasal spray 1 spray (1 spray Left Nare Given by  Other 06/06/15 8657)   Patient had the Afrin pledget in his nose for a period of time. He removed it and I was able to view his septum now. There is a small area that appears to be the site of the bleeding on the anterior septum. Silver nitrate was used to cauterize the area. When patient was rechecked he is not having any more bleeding. He ambulated in the ED without difficulty and did not start to rebleed. We discussed how to manage his nose bleed if it returns again. He is to use the Afrin pledget and pinch the end of his nose to stop the bleeding with ice pack on his forehead. If that does not control the bleeding he should return to the ED. We also discussed he needs to take blood pressure medication which is now very affordable. He was started on Norvasc. We also discussed to avoid straining, sneezing.    MDM   Final diagnoses:  Left-sided epistaxis  Essential hypertension    New Prescriptions   AMLODIPINE (NORVASC) 5 MG TABLET    Take 1 tablet (5 mg total) by mouth daily.    Plan discharge  Devoria Albe, MD, Concha Pyo, MD 06/06/15 [phone]

## 2015-06-06 NOTE — ED Notes (Signed)
No bleeding noted at this time

## 2015-06-06 NOTE — ED Notes (Signed)
When taking patient's BP in left arm BP read high 170/107. Retook BP in right arm BP read 145/103. Patient states that he is suppose to be on Blood pressure medication but not taking it.

## 2015-06-06 NOTE — ED Notes (Signed)
Pt reports intermittent nosebleed from left nare since Wednesday. Pt states usually starts up again when he gets up in the am.

## 2015-06-06 NOTE — Discharge Instructions (Signed)
Start the blood pressure medication. You can go to the health department or [facility] to get a primary care doctor to manage your high blood pressure. This medication is $4 at [facility]. If you're know should start bleeding again spray the cotton ball with the Afrin and place a new left nostril. Pincher nose like an old-fashioned) for 5-10 minutes constantly and put a ice pack on your forehead to help stop the bleeding. If that does not stop the bleeding returned to the emergency department. Nosebleed A nosebleed can be caused by many things, including:  Getting hit hard in the nose.  Infections.  Dry nose.  Colds.  Medicines. Your doctor may do lab testing if you get nosebleeds a lot and the cause is not known. HOME CARE   If your nose was packed with material, keep it there until your doctor takes it out. Put the pack back in your nose if the pack falls out.  Do not blow your nose for 12 hours after the nosebleed.  Sit up and bend forward if your nose starts bleeding again. Pinch the front half of your nose nonstop for 20 minutes.  Put petroleum jelly inside your nose every morning if you have a dry nose.  Use a humidifier to make the air less dry.  Do not take aspirin.  Try not to strain, lift, or bend at the waist for many days after the nosebleed. GET HELP RIGHT AWAY IF:   Nosebleeds keep happening and are hard to stop or control.  You have bleeding or bruises that are not normal on other parts of the body.  You have a fever.  The nosebleeds get worse.  You get lightheaded, feel faint, sweaty, or throw up (vomit) blood. MAKE SURE YOU:   Understand these instructions.  Will watch your condition.  Will get help right away if you are not doing well or get worse. Document Released: 06/01/2008 Document Revised: 11/15/2011 Document Reviewed: 06/01/2008 [facility] Patient Information 2015 [place], [place]. This information is not intended to replace advice given to  you by your health care provider. Make sure you discuss any questions you have with your health care provider.

## 2017-02-08 ENCOUNTER — Emergency Department (HOSPITAL_COMMUNITY)
Admission: EM | Admit: 2017-02-08 | Discharge: 2017-02-08 | Disposition: A | Discharge status: Home / Self Care | Payer: BLUE CROSS/BLUE SHIELD | Attending: Emergency Medicine | Admitting: Emergency Medicine

## 2017-02-08 ENCOUNTER — Encounter (HOSPITAL_COMMUNITY): Payer: Self-pay

## 2017-02-08 ENCOUNTER — Emergency Department (HOSPITAL_COMMUNITY): Payer: BLUE CROSS/BLUE SHIELD

## 2017-02-08 DIAGNOSIS — I10 Essential (primary) hypertension: Secondary | ICD-10-CM | POA: Insufficient documentation

## 2017-02-08 DIAGNOSIS — R51 Headache: Secondary | ICD-10-CM | POA: Insufficient documentation

## 2017-02-08 DIAGNOSIS — F17210 Nicotine dependence, cigarettes, uncomplicated: Secondary | ICD-10-CM | POA: Insufficient documentation

## 2017-02-08 DIAGNOSIS — Z79899 Other long term (current) drug therapy: Secondary | ICD-10-CM | POA: Insufficient documentation

## 2017-02-08 DIAGNOSIS — R519 Headache, unspecified: Secondary | ICD-10-CM

## 2017-02-08 LAB — CBC WITH DIFFERENTIAL/PLATELET
Basophils Absolute: 0.1 10*3/uL (ref 0.0–0.1)
Basophils Relative: 1 %
Eosinophils Absolute: 0.1 10*3/uL (ref 0.0–0.7)
Eosinophils Relative: 2 %
HCT: 38.9 % — ABNORMAL LOW (ref 39.0–52.0)
Hemoglobin: 13.2 g/dL (ref 13.0–17.0)
Lymphocytes Relative: 36 %
Lymphs Abs: 2 10*3/uL (ref 0.7–4.0)
MCH: 33.8 pg (ref 26.0–34.0)
MCHC: 33.9 g/dL (ref 30.0–36.0)
MCV: 99.5 fL (ref 78.0–100.0)
Monocytes Absolute: 0.4 10*3/uL (ref 0.1–1.0)
Monocytes Relative: 8 %
Neutro Abs: 3.1 10*3/uL (ref 1.7–7.7)
Neutrophils Relative %: 53 %
Platelets: 180 10*3/uL (ref 150–400)
RBC: 3.91 MIL/uL — ABNORMAL LOW (ref 4.22–5.81)
RDW: 12.8 % (ref 11.5–15.5)
WBC: 5.7 10*3/uL (ref 4.0–10.5)

## 2017-02-08 LAB — COMPREHENSIVE METABOLIC PANEL
ALT: 47 U/L (ref 17–63)
AST: 80 U/L — ABNORMAL HIGH (ref 15–41)
Albumin: 3.7 g/dL (ref 3.5–5.0)
Alkaline Phosphatase: 43 U/L (ref 38–126)
Anion gap: 10 (ref 5–15)
BUN: 11 mg/dL (ref 6–20)
CO2: 21 mmol/L — ABNORMAL LOW (ref 22–32)
Calcium: 8.4 mg/dL — ABNORMAL LOW (ref 8.9–10.3)
Chloride: 101 mmol/L (ref 101–111)
Creatinine, Ser: 0.81 mg/dL (ref 0.61–1.24)
GFR calc Af Amer: >60 mL/min (ref >60)
GFR calc non Af Amer: >60 mL/min (ref >60)
Glucose, Bld: 182 mg/dL — ABNORMAL HIGH (ref 65–99)
Potassium: 3.7 mmol/L (ref 3.5–5.1)
Sodium: 132 mmol/L — ABNORMAL LOW (ref 135–145)
Total Bilirubin: 1.1 mg/dL (ref 0.3–1.2)
Total Protein: 6.5 g/dL (ref 6.5–8.1)

## 2017-02-08 MED ORDER — TRAMADOL HCL 50 MG PO TABS: 50.0000 mg | ORAL_TABLET | Freq: Four times a day (QID) | ORAL | 0 refills | Status: DC | PRN

## 2017-02-08 MED ORDER — LISINOPRIL 20 MG PO TABS
20.0000 mg | ORAL_TABLET | Freq: Every day | ORAL | 0 refills | Status: DC
Start: 2017-02-08 — End: 2017-02-13

## 2017-02-08 MED ORDER — HYDROCODONE-ACETAMINOPHEN 5-325 MG PO TABS
1.0000 | ORAL_TABLET | Freq: Once | ORAL | Status: AC
  Administered 2017-02-08: 1 via ORAL
  Filled 2017-02-08: qty 1

## 2017-02-08 NOTE — Discharge Instructions (Signed)
Follow-up with Dr.Luking in a couple weeks to check her blood pressure. [facility]

## 2017-02-08 NOTE — ED Provider Notes (Signed)
[facility]-EMERGENCY DEPT Provider Note   CSN: [id number] Arrival date & time: 02/08/17  1004     History   Chief Complaint Chief Complaint  Patient presents with  . Headache    HPI Mike Keller is a 51 y.o. male.  Patient complains of a headache. She has history of high blood pressure but he has not been taking any medicine for a while because it is expensive    Headache   This is a new problem. The current episode started more than 2 days ago. The problem occurs constantly. The problem has not changed since onset.The headache is associated with nothing. The pain is located in the occipital region. The quality of the pain is described as dull. The pain is at a severity of 3/10. The pain is moderate. The pain does not radiate. Pertinent negatives include no anorexia.    Past Medical History:  Diagnosis Date  . Chronic back pain   . Hypertension     There are no active problems to display for this patient.   Past Surgical History:  Procedure Laterality Date  . HAND SURGERY     left  . HAND SURGERY         Home Medications    Prior to Admission medications   Medication Sig Start Date End Date Taking? Authorizing Provider  naproxen (NAPROSYN) 500 MG tablet Take 1 tablet (500 mg total) by mouth 2 (two) times daily. 07/09/13  Yes Vanetta Mulders, MD  amLODipine (NORVASC) 5 MG tablet Take 1 tablet (5 mg total) by mouth daily. Patient not taking: Reported on 02/08/2017 06/06/15   Devoria Albe, MD  DM-Doxylamine-Acetaminophen (NYQUIL COLD & FLU PO) Take 2 capsules by mouth every 6 (six) hours as needed. Cold/flu    [provider]  guaiFENesin-dextromethorphan (ROBITUSSIN DM) 100-10 MG/5ML syrup Take 10 mLs by mouth every 4 (four) hours as needed for cough.    [provider]  lisinopril (PRINIVIL,ZESTRIL) 20 MG tablet Take 1 tablet (20 mg total) by mouth daily. 02/08/17   Bethann Berkshire, MD  traMADol (ULTRAM) 50 MG tablet Take 1 tablet (50 mg total) by mouth every  6 (six) hours as needed for moderate pain. 02/08/17   Bethann Berkshire, MD    Family History No family history on file.  Social History Social History  Substance Use Topics  . Smoking status: Current Every Day Smoker    Types: Cigarettes  . Smokeless tobacco: Never Used  . Alcohol use Yes     Comment: beer, 2 cans daily     Allergies   Patient has no known allergies.   Review of Systems Review of Systems  Constitutional: Negative for appetite change and fatigue.  HENT: Negative for congestion, ear discharge and sinus pressure.   Eyes: Negative for discharge.  Respiratory: Negative for cough.   Cardiovascular: Negative for chest pain.  Gastrointestinal: Negative for abdominal pain, anorexia and diarrhea.  Genitourinary: Negative for frequency and hematuria.  Musculoskeletal: Negative for back pain.  Skin: Negative for rash.  Neurological: Positive for headaches. Negative for seizures.  Psychiatric/Behavioral: Negative for hallucinations.     Physical Exam Updated Vital Signs BP (!) 156/95   Pulse 72   Temp 98.5 F (36.9 C) (Oral)   Resp (!) 21   Ht 5\' 7"  (1.702 m)   Wt 59 kg (130 lb)   SpO2 97%   BMI 20.36 kg/m   Physical Exam  Constitutional: He is oriented to person, place, and time. He appears well-developed.  HENT:  Head: Normocephalic.  Supple neck  Eyes: Conjunctivae and EOM are normal. No scleral icterus.  Neck: Neck supple. No thyromegaly present.  Cardiovascular: Normal rate and regular rhythm.  Exam reveals no gallop and no friction rub.   No murmur heard. Pulmonary/Chest: No stridor. He has no wheezes. He has no rales. He exhibits no tenderness.  Abdominal: He exhibits no distension. There is no tenderness. There is no rebound.  Musculoskeletal: Normal range of motion. He exhibits no edema.  Lymphadenopathy:    He has no cervical adenopathy.  Neurological: He is oriented to person, place, and time. He exhibits normal muscle tone. Coordination  normal.  Skin: No rash noted. No erythema.  Psychiatric: He has a normal mood and affect. His behavior is normal.     ED Treatments / Results  Labs (all labs ordered are listed, but only abnormal results are displayed) Labs Reviewed  CBC WITH DIFFERENTIAL/PLATELET - Abnormal; Notable for the following:       Result Value   RBC 3.91 (*)    HCT 38.9 (*)    All other components within normal limits  COMPREHENSIVE METABOLIC PANEL - Abnormal; Notable for the following:    Sodium 132 (*)    CO2 21 (*)    Glucose, Bld 182 (*)    Calcium 8.4 (*)    AST 80 (*)    All other components within normal limits    EKG  EKG Interpretation None       Radiology Ct Head Wo Contrast  Result Date: 02/08/2017 CLINICAL DATA:  51 year old male with headache for 1 month. Hypertension. EXAM: CT HEAD WITHOUT CONTRAST TECHNIQUE: Contiguous axial images were obtained from the base of the skull through the vertex without intravenous contrast. COMPARISON:  Head CT without contrast 03/03/2007. FINDINGS: Brain: Mild generalized cerebral volume loss since 2008. No midline shift, ventriculomegaly, mass effect, evidence of mass lesion, intracranial hemorrhage or evidence of cortically based acute infarction. Gray-white matter differentiation is within normal limits throughout the brain. Vascular: No suspicious intracranial vascular hyperdensity. Skull: Stable and negative. Sinuses/Orbits: Scattered ethmoid sinus mucosal thickening and opacification similar to the 2008 study. Mild mucosal thickening now in both sphenoid sinuses. Other visible paranasal sinuses and mastoids are stable and well pneumatized. Other: Visualized orbits and scalp soft tissues are within normal limits. IMPRESSION: 1. Mild generalized cerebral volume loss since 2008 but otherwise normal noncontrast CT appearance of the brain. 2. Mild paranasal sinus inflammation, increased compared to 2008. Electronically Signed   By: Odessa Fleming M.D.   On:  02/08/2017 11:41    Procedures Procedures (including critical care time)  Medications Ordered in ED Medications  HYDROcodone-acetaminophen (NORCO/VICODIN) 5-325 MG per tablet 1 tablet (1 tablet Oral Given 02/08/17 1059)     Initial Impression / Assessment and Plan / ED Course  I have reviewed the triage vital signs and the nursing notes.  Pertinent labs & imaging results that were available during my care of the patient were reviewed by me and considered in my medical decision making (see chart for details).     CT scan of the head and lab were unremarkable. Patient's headache improved with treatment. Suspect headaches are more related to uncontrolled blood pressure. Patient will be placed on lisinopril given some Ultram will follow-up with her family physician  Final Clinical Impressions(s) / ED Diagnoses   Final diagnoses:  Bad headache    New Prescriptions New Prescriptions   LISINOPRIL (PRINIVIL,ZESTRIL) 20 MG TABLET    Take 1  tablet (20 mg total) by mouth daily.   TRAMADOL (ULTRAM) 50 MG TABLET    Take 1 tablet (50 mg total) by mouth every 6 (six) hours as needed for moderate pain.     Bethann BerkshireZammit, Mikala Podoll, MD 02/08/17 1431

## 2017-02-08 NOTE — ED Notes (Signed)
EKG given to Dr. Zammit 

## 2017-02-08 NOTE — ED Notes (Signed)
Pt made aware to return if symptoms worsen or if any life threatening symptoms occur.   

## 2017-02-08 NOTE — ED Triage Notes (Signed)
Pt reports headache and dizziness off and on for the past month.  Reports constant since Thursday.  Denies n/v.

## 2017-02-08 NOTE — ED Notes (Signed)
Pt says is supposed to be on bp medication but hasn't taken it in 3 years.

## 2017-02-12 ENCOUNTER — Observation Stay (HOSPITAL_COMMUNITY)
Admission: EM | Admit: 2017-02-12 | Discharge: 2017-02-13 | Disposition: A | Discharge status: Home / Self Care | Payer: BLUE CROSS/BLUE SHIELD | Attending: Internal Medicine | Admitting: Internal Medicine

## 2017-02-12 ENCOUNTER — Encounter (HOSPITAL_COMMUNITY): Payer: Self-pay | Admitting: Emergency Medicine

## 2017-02-12 DIAGNOSIS — M549 Dorsalgia, unspecified: Secondary | ICD-10-CM | POA: Diagnosis not present

## 2017-02-12 DIAGNOSIS — G8929 Other chronic pain: Secondary | ICD-10-CM

## 2017-02-12 DIAGNOSIS — T464X5A Adverse effect of angiotensin-converting-enzyme inhibitors, initial encounter: Secondary | ICD-10-CM | POA: Diagnosis not present

## 2017-02-12 DIAGNOSIS — T783XXA Angioneurotic edema, initial encounter: Secondary | ICD-10-CM | POA: Diagnosis not present

## 2017-02-12 DIAGNOSIS — I10 Essential (primary) hypertension: Secondary | ICD-10-CM

## 2017-02-12 DIAGNOSIS — R229 Localized swelling, mass and lump, unspecified: Secondary | ICD-10-CM | POA: Insufficient documentation

## 2017-02-12 DIAGNOSIS — Z79899 Other long term (current) drug therapy: Secondary | ICD-10-CM | POA: Insufficient documentation

## 2017-02-12 LAB — CBC WITH DIFFERENTIAL/PLATELET
Basophils Absolute: 0 10*3/uL (ref 0.0–0.1)
Basophils Relative: 0 %
Eosinophils Absolute: 0.1 10*3/uL (ref 0.0–0.7)
Eosinophils Relative: 1 %
HCT: 37.7 % — ABNORMAL LOW (ref 39.0–52.0)
Hemoglobin: 12.8 g/dL — ABNORMAL LOW (ref 13.0–17.0)
Lymphocytes Relative: 14 %
Lymphs Abs: 1 10*3/uL (ref 0.7–4.0)
MCH: 34 pg (ref 26.0–34.0)
MCHC: 34 g/dL (ref 30.0–36.0)
MCV: 100 fL (ref 78.0–100.0)
Monocytes Absolute: 0.2 10*3/uL (ref 0.1–1.0)
Monocytes Relative: 3 %
Neutro Abs: 5.9 10*3/uL (ref 1.7–7.7)
Neutrophils Relative %: 82 %
Platelets: 201 10*3/uL (ref 150–400)
RBC: 3.77 MIL/uL — ABNORMAL LOW (ref 4.22–5.81)
RDW: 12.6 % (ref 11.5–15.5)
WBC: 7.2 10*3/uL (ref 4.0–10.5)

## 2017-02-12 LAB — BASIC METABOLIC PANEL
Anion gap: 9 (ref 5–15)
BUN: 9 mg/dL (ref 6–20)
CO2: 22 mmol/L (ref 22–32)
Calcium: 8.9 mg/dL (ref 8.9–10.3)
Chloride: 107 mmol/L (ref 101–111)
Creatinine, Ser: 0.75 mg/dL (ref 0.61–1.24)
GFR calc Af Amer: >60 mL/min (ref >60)
GFR calc non Af Amer: >60 mL/min (ref >60)
Glucose, Bld: 79 mg/dL (ref 65–99)
Potassium: 4.8 mmol/L (ref 3.5–5.1)
Sodium: 138 mmol/L (ref 135–145)

## 2017-02-12 LAB — SAMPLE TO BLOOD BANK

## 2017-02-12 MED ORDER — METHYLPREDNISOLONE SODIUM SUCC 125 MG IJ SOLR
80.0000 mg | Freq: Two times a day (BID) | INTRAMUSCULAR | Status: DC
  Administered 2017-02-13: 80 mg via INTRAVENOUS
  Filled 2017-02-12: qty 2

## 2017-02-12 MED ORDER — SODIUM CHLORIDE 0.9 % IV SOLN
250.0000 mL | INTRAVENOUS | Status: DC | PRN
  Administered 2017-02-13: 10 mL/h via INTRAVENOUS

## 2017-02-12 MED ORDER — METHYLPREDNISOLONE SODIUM SUCC 125 MG IJ SOLR
125.0000 mg | Freq: Once | INTRAMUSCULAR | Status: AC
Start: 2017-02-12 — End: 2017-02-12
  Administered 2017-02-12: 125 mg via INTRAVENOUS
  Filled 2017-02-12: qty 2

## 2017-02-12 MED ORDER — FAMOTIDINE IN NACL 20-0.9 MG/50ML-% IV SOLN
20.0000 mg | Freq: Two times a day (BID) | INTRAVENOUS | Status: DC
  Administered 2017-02-13: 20 mg via INTRAVENOUS
  Filled 2017-02-12: qty 50

## 2017-02-12 MED ORDER — SODIUM CHLORIDE 0.9% FLUSH: 3.0000 mL | INTRAVENOUS | Status: DC | PRN

## 2017-02-12 MED ORDER — DIPHENHYDRAMINE HCL 50 MG/ML IJ SOLN
50.0000 mg | Freq: Once | INTRAMUSCULAR | Status: AC
  Administered 2017-02-12: 50 mg via INTRAVENOUS
  Filled 2017-02-12: qty 1

## 2017-02-12 MED ORDER — FAMOTIDINE IN NACL 20-0.9 MG/50ML-% IV SOLN
20.0000 mg | Freq: Once | INTRAVENOUS | Status: AC
  Administered 2017-02-12: 20 mg via INTRAVENOUS
  Filled 2017-02-12: qty 50

## 2017-02-12 MED ORDER — DIPHENHYDRAMINE HCL 25 MG PO CAPS
25.0000 mg | ORAL_CAPSULE | Freq: Four times a day (QID) | ORAL | Status: DC | PRN
  Administered 2017-02-13 (×2): 25 mg via ORAL
  Filled 2017-02-12 (×2): qty 1

## 2017-02-12 MED ORDER — SODIUM CHLORIDE 0.9% FLUSH
3.0000 mL | Freq: Two times a day (BID) | INTRAVENOUS | Status: DC
  Administered 2017-02-13: 3 mL via INTRAVENOUS

## 2017-02-12 NOTE — ED Provider Notes (Signed)
2245:  Patient reevaluated. He states his lip and cheeks are more swollen. Airway examined and is patent. Will discuss with hospitalist for possible observation.   Donnetta Hutchingook, Wataru Mccowen, MD 02/12/17 [phone]

## 2017-02-12 NOTE — ED Notes (Signed)
Facial swelling increasing. EDP made aware.

## 2017-02-12 NOTE — ED Provider Notes (Signed)
[facility]-EM[facility]ENCY DEPT Provider Note   CSN: [id number] Arrival date & time: 02/12/17  1841     History   Chief Complaint Chief Complaint  Patient presents with  . Facial Swelling    HPI Mike Keller is a 51 y.o. male.  HPI  Pt was seen at 1855. Per pt, c/o gradual onset and persistence of constant upper lip swelling that began approximately 10am this morning. Pt states he started to take a new medication lisinopril in the past few days. Denies intra-oral edema, no SOB, no wheezing, no stridor, no dysphagia, no abd pain, no rash.    Past Medical History:  Diagnosis Date  . Chronic back pain   . Hypertension     There are no active problems to display for this patient.   Past Surgical History:  Procedure Laterality Date  . HAND SURGERY     left  . HAND SURGERY         Home Medications    Prior to Admission medications   Medication Sig Start Date End Date Taking? Authorizing Provider  amLODipine (NORVASC) 5 MG tablet Take 1 tablet (5 mg total) by mouth daily. Patient not taking: Reported on 02/08/2017 06/06/15   Devoria Albe, MD  DM-Doxylamine-Acetaminophen (NYQUIL COLD & FLU PO) Take 2 capsules by mouth every 6 (six) hours as needed. Cold/flu    [provider]  guaiFENesin-dextromethorphan (ROBITUSSIN DM) 100-10 MG/5ML syrup Take 10 mLs by mouth every 4 (four) hours as needed for cough.    [provider]  lisinopril (PRINIVIL,ZESTRIL) 20 MG tablet Take 1 tablet (20 mg total) by mouth daily. 02/08/17   Bethann Berkshire, MD  naproxen (NAPROSYN) 500 MG tablet Take 1 tablet (500 mg total) by mouth 2 (two) times daily. 07/09/13   Vanetta Mulders, MD  traMADol (ULTRAM) 50 MG tablet Take 1 tablet (50 mg total) by mouth every 6 (six) hours as needed for moderate pain. 02/08/17   Bethann Berkshire, MD    Family History No family history on file.  Social History Social History  Substance Use Topics  . Smoking status: Current Every Day Smoker    Types: Cigarettes    . Smokeless tobacco: Never Used  . Alcohol use Yes     Comment: beer, 2 cans daily     Allergies   Patient has no known allergies.   Review of Systems Review of Systems ROS: Statement: All systems negative except as marked or noted in the HPI; Constitutional: Negative for fever and chills. ; ; Eyes: Negative for eye pain, redness and discharge. ; ; ENMT: Negative for ear pain, hoarseness, nasal congestion, sinus pressure and sore throat. ; ; Cardiovascular: Negative for chest pain, palpitations, diaphoresis, dyspnea and peripheral edema. ; ; Respiratory: Negative for cough, wheezing and stridor. ; ; Gastrointestinal: Negative for nausea, vomiting, diarrhea, abdominal pain, blood in stool, hematemesis, jaundice and rectal bleeding. . ; ; Genitourinary: Negative for dysuria, flank pain and hematuria. ; ; Musculoskeletal: Negative for back pain and neck pain. Negative for swelling and trauma.; ; Skin: +swelling upper lip. Negative for pruritus, rash, abrasions, blisters, bruising and skin lesion.; ; Neuro: Negative for headache, lightheadedness and neck stiffness. Negative for weakness, altered level of consciousness, altered mental status, extremity weakness, paresthesias, involuntary movement, seizure and syncope.       Physical Exam Updated Vital Signs BP (!) 152/96 (BP Location: Right Arm)   Pulse 86   Temp 98.9 F (37.2 C) (Oral)   Resp 16   Ht  5\' 7"  (1.702 m)   Wt 59.9 kg (132 lb)   SpO2 99%   BMI 20.67 kg/m   Physical Exam 1900: Physical examination:  Nursing notes reviewed; Vital signs and O2 SAT reviewed;  Constitutional: Well developed, Well nourished, Well hydrated, In no acute distress; Head:  Normocephalic, atraumatic; Eyes: EOMI, PERRL, No scleral icterus; ENMT: Mouth and pharynx normal, Mucous membranes moist. Mouth and pharynx without lesions. +upper lip edema R>L.  No intra-oral edema. No submandibular or sublingual edema. No hoarse voice, no drooling, no stridor. No  trismus. ; Neck: Supple, Full range of motion, No lymphadenopathy; Cardiovascular: Regular rate and rhythm, No gallop; Respiratory: Breath sounds clear & equal bilaterally, No wheezes.  Speaking full sentences with ease, Normal respiratory effort/excursion; Chest: Nontender, Movement normal; Abdomen: Soft, Nontender, Nondistended, Normal bowel sounds; Genitourinary: No CVA tenderness; Extremities: Pulses normal, No tenderness, No edema, No calf edema or asymmetry.; Neuro: AA&Ox3, Major CN grossly intact.  Speech clear. No gross focal motor or sensory deficits in extremities. Climbs on and off stretcher easily by himself. Gait steady.; Skin: Color normal, Warm, Dry.   ED Treatments / Results  Labs (all labs ordered are listed, but only abnormal results are displayed)   EKG  EKG Interpretation None       Radiology   Procedures Procedures (including critical care time)  Medications Ordered in ED Medications  methylPREDNISolone sodium succinate (SOLU-MEDROL) 125 mg/2 mL injection 125 mg (not administered)  diphenhydrAMINE (BENADRYL) injection 50 mg (not administered)  famotidine (PEPCID) IVPB 20 mg premix (not administered)     Initial Impression / Assessment and Plan / ED Course  I have reviewed the triage vital signs and the nursing notes.  Pertinent labs & imaging results that were available during my care of the patient were reviewed by me and considered in my medical decision making (see chart for details).  MDM Reviewed: previous chart, nursing note and vitals Reviewed previous: labs Interpretation: labs   Results for orders placed or performed during the hospital encounter of 02/12/17  Basic metabolic panel  Result Value Ref Range   Sodium 138 135 - 145 mmol/L   Potassium 4.8 3.5 - 5.1 mmol/L   Chloride 107 101 - 111 mmol/L   CO2 22 22 - 32 mmol/L   Glucose, Bld 79 65 - 99 mg/dL   BUN 9 6 - 20 mg/dL   Creatinine, Ser 1.610.75 0.61 - 1.24 mg/dL   Calcium 8.9 8.9 - 09.610.3  mg/dL   GFR calc non Af Amer >60 >60 mL/min   GFR calc Af Amer >60 >60 mL/min   Anion gap 9 5 - 15  CBC with Differential  Result Value Ref Range   WBC 7.2 4.0 - 10.5 K/uL   RBC 3.77 (L) 4.22 - 5.81 MIL/uL   Hemoglobin 12.8 (L) 13.0 - 17.0 g/dL   HCT 04.537.7 (L) 40.939.0 - 81.152.0 %   MCV 100.0 78.0 - 100.0 fL   MCH 34.0 26.0 - 34.0 pg   MCHC 34.0 30.0 - 36.0 g/dL   RDW 91.412.6 78.211.5 - 95.615.5 %   Platelets 201 150 - 400 K/uL   Neutrophils Relative % 82 %   Neutro Abs 5.9 1.7 - 7.7 K/uL   Lymphocytes Relative 14 %   Lymphs Abs 1.0 0.7 - 4.0 K/uL   Monocytes Relative 3 %   Monocytes Absolute 0.2 0.1 - 1.0 K/uL   Eosinophils Relative 1 %   Eosinophils Absolute 0.1 0.0 - 0.7 K/uL   Basophils  Relative 0 %   Basophils Absolute 0.0 0.0 - 0.1 K/uL  Sample to Blood Bank  Result Value Ref Range   Blood Bank Specimen SAMPLE AVAILABLE FOR TESTING    Sample Expiration 02/13/2017     2155:  Pt given IV benadryl, solumedrol and pepcid with some improvement. Will need to observe in ED until 2300, dispo (admit vs d/c) based on improvement. Sign out to Dr. Adriana Simas.    Final Clinical Impressions(s) / ED Diagnoses   Final diagnoses:  None    New Prescriptions New Prescriptions   No medications on file     Samuel Jester, DO 02/12/17 2158

## 2017-02-12 NOTE — ED Triage Notes (Addendum)
Pt reports first noticed right lip swelling this morning. The swelling has spread across his lips now. Denies SHOB or trouble breathing. NAD noted. Pt unsure of cause. Pt took benadryl around noon.

## 2017-02-13 DIAGNOSIS — I10 Essential (primary) hypertension: Secondary | ICD-10-CM | POA: Diagnosis not present

## 2017-02-13 DIAGNOSIS — T464X5A Adverse effect of angiotensin-converting-enzyme inhibitors, initial encounter: Secondary | ICD-10-CM

## 2017-02-13 DIAGNOSIS — T464X1A Poisoning by angiotensin-converting-enzyme inhibitors, accidental (unintentional), initial encounter: Secondary | ICD-10-CM

## 2017-02-13 DIAGNOSIS — T783XXD Angioneurotic edema, subsequent encounter: Secondary | ICD-10-CM

## 2017-02-13 DIAGNOSIS — T783XXA Angioneurotic edema, initial encounter: Secondary | ICD-10-CM | POA: Diagnosis not present

## 2017-02-13 MED ORDER — AMLODIPINE BESYLATE 5 MG PO TABS
5.0000 mg | ORAL_TABLET | Freq: Every day | ORAL | Status: DC
  Administered 2017-02-13: 5 mg via ORAL
  Filled 2017-02-13: qty 1

## 2017-02-13 MED ORDER — NICOTINE 21 MG/24HR TD PT24
21.0000 mg | MEDICATED_PATCH | Freq: Every day | TRANSDERMAL | Status: DC
  Administered 2017-02-13: 21 mg via TRANSDERMAL
  Filled 2017-02-13: qty 1

## 2017-02-13 MED ORDER — PREDNISONE 20 MG PO TABS
60.0000 mg | ORAL_TABLET | Freq: Every day | ORAL | Status: DC
  Administered 2017-02-13: 60 mg via ORAL
  Filled 2017-02-13: qty 3

## 2017-02-13 MED ORDER — PNEUMOCOCCAL VAC POLYVALENT 25 MCG/0.5ML IJ INJ: 0.5000 mL | INJECTION | INTRAMUSCULAR | Status: DC

## 2017-02-13 MED ORDER — AMLODIPINE BESYLATE 5 MG PO TABS: 5.0000 mg | ORAL_TABLET | Freq: Every day | ORAL | 1 refills | Status: DC

## 2017-02-13 MED ORDER — PREDNISONE 10 MG PO TABS: 60.0000 mg | ORAL_TABLET | Freq: Every day | ORAL | 0 refills | Status: DC

## 2017-02-13 NOTE — H&P (Signed)
History and Physical    Mike Keller ZOX:[id number] DOB: 02/21/1966 DOA: 02/12/2017  PCP: Patient, No Pcp Per  Patient coming from: home  Chief Complaint: lip swelling  HPI: Mike Keller is a 51 y.o. male with medical history significant of recent dx of htn started on lisinopril 2 days ago comes in with lip swelling that has gotten worse and cheek swelling.  No rash.  This has never happened to him before.  Some scratch in his throat but no sob.  Pt referred for admission for angioedema and monitoring overnight.   Review of Systems: As per HPI otherwise 10 point review of systems negative.   Past Medical History:  Diagnosis Date  . Chronic back pain   . Hypertension     Past Surgical History:  Procedure Laterality Date  . HAND SURGERY     left  . HAND SURGERY       reports that he has been smoking Cigarettes.  He has never used smokeless tobacco. He reports that he drinks alcohol. He reports that he uses drugs, including Marijuana.  No Known Allergies  No family history on file. no premature CAD  Prior to Admission medications   Medication Sig Start Date End Date Taking? Authorizing Provider  lisinopril (PRINIVIL,ZESTRIL) 20 MG tablet Take 1 tablet (20 mg total) by mouth daily. 02/08/17  Yes Bethann BerkshireZammit, Joseph, MD  traMADol (ULTRAM) 50 MG tablet Take 1 tablet (50 mg total) by mouth every 6 (six) hours as needed for moderate pain. 02/08/17  Yes Bethann BerkshireZammit, Joseph, MD    Physical Exam: Vitals:   02/12/17 1846 02/12/17 1847 02/12/17 2149 02/12/17 2331  BP:  (!) 152/96 (!) 158/98 (!) 150/100  Pulse:  86 78 71  Resp:  16 16 18   Temp:  98.9 F (37.2 C)    TempSrc:  Oral    SpO2:  99% 98% 97%  Weight: 59.9 kg (132 lb)     Height: 5\' 7"  (1.702 m)         Constitutional: NAD, calm, comfortable Vitals:   02/12/17 1846 02/12/17 1847 02/12/17 2149 02/12/17 2331  BP:  (!) 152/96 (!) 158/98 (!) 150/100  Pulse:  86 78 71  Resp:  16 16 18   Temp:  98.9 F (37.2 C)    TempSrc:  Oral     SpO2:  99% 98% 97%  Weight: 59.9 kg (132 lb)     Height: 5\' 7"  (1.702 m)      Eyes: PERRL, lids and conjunctivae normal ENMT: Mucous membranes are moist. Posterior pharynx clear of any exudate or lesions.Normal dentition. Upper lip with moderate swelling with bilateral cheek swelling also Neck: normal, supple, no masses, no thyromegaly Respiratory: clear to auscultation bilaterally, no wheezing, no crackles. Normal respiratory effort. No accessory muscle use.  Cardiovascular: Regular rate and rhythm, no murmurs / rubs / gallops. No extremity edema. 2+ pedal pulses. No carotid bruits.  Abdomen: no tenderness, no masses palpated. No hepatosplenomegaly. Bowel sounds positive.  Musculoskeletal: no clubbing / cyanosis. No joint deformity upper and lower extremities. Good ROM, no contractures. Normal muscle tone.  Skin: no rashes, lesions, ulcers. No induration Neurologic: CN 2-12 grossly intact. Sensation intact, DTR normal. Strength 5/5 in all 4.  Psychiatric: Normal judgment and insight. Alert and oriented x 3. Normal mood.    Labs on Admission: I have personally reviewed following labs and imaging studies  CBC:  Recent Labs Lab 02/08/17 1052 02/12/17 2104  WBC 5.7 7.2  NEUTROABS 3.1 5.9  HGB 13.2 12.8*  HCT 38.9* 37.7*  MCV 99.5 100.0  PLT 180 201   Basic Metabolic Panel:  Recent Labs Lab 02/08/17 1052 02/12/17 2104  NA 132* 138  K 3.7 4.8  CL 101 107  CO2 21* 22  GLUCOSE 182* 79  BUN 11 9  CREATININE 0.81 0.75  CALCIUM 8.4* 8.9   GFR: Estimated Creatinine Clearance: 93.6 mL/min (by C-G formula based on SCr of 0.75 mg/dL). Liver Function Tests:  Recent Labs Lab 02/08/17 1052  AST 80*  ALT 47  ALKPHOS 43  BILITOT 1.1  PROT 6.5  ALBUMIN 3.7   No results for input(s): LIPASE, AMYLASE in the last 168 hours. No results for input(s): AMMONIA in the last 168 hours. Coagulation Profile: No results for input(s): INR, PROTIME in the last 168 hours. Cardiac  Enzymes: No results for input(s): CKTOTAL, CKMB, CKMBINDEX, TROPONINI in the last 168 hours. BNP (last 3 results) No results for input(s): PROBNP in the last 8760 hours. HbA1C: No results for input(s): HGBA1C in the last 72 hours. CBG: No results for input(s): GLUCAP in the last 168 hours. Lipid Profile: No results for input(s): CHOL, HDL, LDLCALC, TRIG, CHOLHDL, LDLDIRECT in the last 72 hours. Thyroid Function Tests: No results for input(s): TSH, T4TOTAL, FREET4, T3FREE, THYROIDAB in the last 72 hours. Anemia Panel: No results for input(s): VITAMINB12, FOLATE, FERRITIN, TIBC, IRON, RETICCTPCT in the last 72 hours. Urine analysis:    Component Value Date/Time   COLORURINE YELLOW 06/09/2011 1826   APPEARANCEUR CLEAR 06/09/2011 1826   LABSPEC <1.005 (L) 06/09/2011 1826   PHURINE 6.0 06/09/2011 1826   GLUCOSEU NEGATIVE 06/09/2011 1826   HGBUR NEGATIVE 06/09/2011 1826   BILIRUBINUR NEGATIVE 06/09/2011 1826   KETONESUR NEGATIVE 06/09/2011 1826   PROTEINUR NEGATIVE 06/09/2011 1826   UROBILINOGEN 0.2 06/09/2011 1826   NITRITE NEGATIVE 06/09/2011 1826   LEUKOCYTESUR NEGATIVE 06/09/2011 1826   Sepsis Labs: !!!!!!!!!!!!!!!!!!!!!!!!!!!!!!!!!!!!!!!!!!!! @LABRCNTIP (procalcitonin:4,lacticidven:4) )No results found for this or any previous visit (from the past 240 hour(s)).   Radiological Exams on Admission: No results found.  Assessment/Plan 51 yo african [place] male with recent start of lisinopril comes in with moderate amount of angioedema Principal Problem:   Angioedema- airway protecting at this time.  Monitor overnight.  Should improve overnight.  Cont pepcid, benadryl and solumedrol.  Stop ace inhibitor.  Has been educated about ace inh in the future.  Continuous pulse ox monitoring.  Active Problems:   Chronic back pain- noted   Hypertension- monitor, hold ace    DVT prophylaxis: scds Code Status:  full Family Communication:  girlfriend  Disposition Plan:  Per day  team Consults called:  none Admission status:  observation   Jveon Pound A MD [facility]  If 7PM-7AM, please contact night-coverage [email or web address] Password [email or web address]  02/13/2017, 12:01 AM

## 2017-02-13 NOTE — Discharge Summary (Signed)
Physician Discharge Summary  Mike Keller ZOX:[id number] DOB: 06/29/1966 DOA: 02/12/2017  PCP: Patient, No Pcp Per  Admit date: 02/12/2017 Discharge date: 02/13/2017  Admitted From: Home Disposition:  Home   Recommendations for Outpatient Follow-up:  1. Follow up with PCP in 1-2 weeks 2. Please obtain BMP/CBC in one week     Discharge Condition: Stable CODE STATUS: FULL Diet recommendation: Heart Healthy   Brief/Interim Summary: 51 year old male with a history of hypertension recently diagnosed after a visit to the emergency department on 02/08/2017 for intractable headache. The patient was discharged home with lisinopril 20 mg daily at that time. The patient began taking the medication on 02/09/2017. On the morning of 02/12/2017, the patient noted some swelling of his lips that extended to his face. The patient denied any tongue swelling, shortness breath, or difficulty swallowing. He denies any recent new medications, and denies taking anything over-the-counter other than occasional ibuprofen which he has not taken for 3 weeks. He denies any recent travels or eating any unusual, raw or exotic foods.  He denies any new soaps or detergents. He denies any fevers, chills, chest discomfort, short of breath, nausea vomiting, diarrhea, abdominal pain, dysuria, hematuria. Upon presentation, the patient was afebrile and hemodynamically stable with essentially unremarkable BMP and CBC. The patient was made for observation secondary to angioedema and started on intravenous Solu-Medrol, Pepcid, and diphenhydramine.  Discharge Diagnoses:  Angioedema -Secondary to ACE inhibitor -pt has been educated to avoid all ACEi and ARBs -continues to  improve -change to po prednisone--home with prednisone taper over next 6 days -advance diet which pt tolerated -continue pepcid for now -no difficulty breathing; 100% on RA  Essential Hypertension -avoiding ACEi and ARBs -d/c with amlodipine 5 mg  daily -pt has appointment to follow up with Dr. Fletcher AnonS. Luking in one week  Poor Dentition -pt states he has been seeing a dentist  Tobacco abuse -cessation discussed -nicoderm patch   Discharge Instructions  Discharge Instructions    Diet - low sodium heart healthy    Complete by:  As directed    Increase activity slowly    Complete by:  As directed      Allergies as of 02/13/2017   No Known Allergies     Medication List    STOP taking these medications   lisinopril 20 MG tablet Commonly known as:  PRINIVIL,ZESTRIL     TAKE these medications   amLODipine 5 MG tablet Commonly known as:  NORVASC Take 1 tablet (5 mg total) by mouth daily.   predniSONE 10 MG tablet Commonly known as:  DELTASONE Take 6 tablets (60 mg total) by mouth daily with breakfast. And decrease by one tablet daily Start taking on:  02/14/2017   traMADol 50 MG tablet Commonly known as:  ULTRAM Take 1 tablet (50 mg total) by mouth every 6 (six) hours as needed for moderate pain.       No Known Allergies  Consultations:  none   Procedures/Studies: Ct Head Wo Contrast  Result Date: 02/08/2017 CLINICAL DATA:  51 year old male with headache for 1 month. Hypertension. EXAM: CT HEAD WITHOUT CONTRAST TECHNIQUE: Contiguous axial images were obtained from the base of the skull through the vertex without intravenous contrast. COMPARISON:  Head CT without contrast 03/03/2007. FINDINGS: Brain: Mild generalized cerebral volume loss since 2008. No midline shift, ventriculomegaly, mass effect, evidence of mass lesion, intracranial hemorrhage or evidence of cortically based acute infarction. Gray-white matter differentiation is within normal limits throughout the brain. Vascular: No suspicious  intracranial vascular hyperdensity. Skull: Stable and negative. Sinuses/Orbits: Scattered ethmoid sinus mucosal thickening and opacification similar to the 2008 study. Mild mucosal thickening now in both sphenoid sinuses.  Other visible paranasal sinuses and mastoids are stable and well pneumatized. Other: Visualized orbits and scalp soft tissues are within normal limits. IMPRESSION: 1. Mild generalized cerebral volume loss since 2008 but otherwise normal noncontrast CT appearance of the brain. 2. Mild paranasal sinus inflammation, increased compared to 2008. Electronically Signed   By: Odessa Fleming M.D.   On: 02/08/2017 11:41         Discharge Exam: Vitals:   02/13/17 1145 02/13/17 1300  BP:  139/86  Pulse: 98 77  Resp: (!) 25 15  Temp: 97.9 F (36.6 C)    Vitals:   02/13/17 1000 02/13/17 1100 02/13/17 1145 02/13/17 1300  BP: (!) 144/91 (!) 140/92  139/86  Pulse: 69 70 98 77  Resp: 19 15 (!) 25 15  Temp:   97.9 F (36.6 C)   TempSrc:   Oral   SpO2: 98% 98% 96% 99%  Weight:      Height:        General: Pt is alert, awake, not in acute distress--see picture below Cardiovascular: RRR, S1/S2 +, no rubs, no gallops Respiratory: CTA bilaterally, no wheezing, no rhonchi Abdominal: Soft, NT, ND, bowel sounds + Extremities: no edema, no cyanosis       The results of significant diagnostics from this hospitalization (including imaging, microbiology, ancillary and laboratory) are listed below for reference.    Significant Diagnostic Studies: Ct Head Wo Contrast  Result Date: 02/08/2017 CLINICAL DATA:  51 year old male with headache for 1 month. Hypertension. EXAM: CT HEAD WITHOUT CONTRAST TECHNIQUE: Contiguous axial images were obtained from the base of the skull through the vertex without intravenous contrast. COMPARISON:  Head CT without contrast 03/03/2007. FINDINGS: Brain: Mild generalized cerebral volume loss since 2008. No midline shift, ventriculomegaly, mass effect, evidence of mass lesion, intracranial hemorrhage or evidence of cortically based acute infarction. Gray-white matter differentiation is within normal limits throughout the brain. Vascular: No suspicious intracranial vascular  hyperdensity. Skull: Stable and negative. Sinuses/Orbits: Scattered ethmoid sinus mucosal thickening and opacification similar to the 2008 study. Mild mucosal thickening now in both sphenoid sinuses. Other visible paranasal sinuses and mastoids are stable and well pneumatized. Other: Visualized orbits and scalp soft tissues are within normal limits. IMPRESSION: 1. Mild generalized cerebral volume loss since 2008 but otherwise normal noncontrast CT appearance of the brain. 2. Mild paranasal sinus inflammation, increased compared to 2008. Electronically Signed   By: Odessa Fleming M.D.   On: 02/08/2017 11:41     Microbiology: No results found for this or any previous visit (from the past 240 hour(s)).   Labs: Basic Metabolic Panel:  Recent Labs Lab 02/08/17 1052 02/12/17 2104  NA 132* 138  K 3.7 4.8  CL 101 107  CO2 21* 22  GLUCOSE 182* 79  BUN 11 9  CREATININE 0.81 0.75  CALCIUM 8.4* 8.9   Liver Function Tests:  Recent Labs Lab 02/08/17 1052  AST 80*  ALT 47  ALKPHOS 43  BILITOT 1.1  PROT 6.5  ALBUMIN 3.7   No results for input(s): LIPASE, AMYLASE in the last 168 hours. No results for input(s): AMMONIA in the last 168 hours. CBC:  Recent Labs Lab 02/08/17 1052 02/12/17 2104  WBC 5.7 7.2  NEUTROABS 3.1 5.9  HGB 13.2 12.8*  HCT 38.9* 37.7*  MCV 99.5 100.0  PLT 180 201  Cardiac Enzymes: No results for input(s): CKTOTAL, CKMB, CKMBINDEX, TROPONINI in the last 168 hours. BNP: Invalid input(s): POCBNP CBG: No results for input(s): GLUCAP in the last 168 hours.  Time coordinating discharge:  Greater than 30 minutes  Signed:  Harmonee Tozer, DO [facility] Pager: [phone] 02/13/2017, 2:02 PM

## 2017-02-13 NOTE — Progress Notes (Signed)
PROGRESS NOTE  Mike Keller WUJ:[id number] DOB: 07/28/1966 DOA: 02/12/2017 PCP: Patient, No Pcp Per  Brief History:  51 year old male with a history of hypertension recently diagnosed after a visit to the emergency department on 02/08/2017 for intractable headache. The patient was discharged home with lisinopril 20 mg daily at that time. The patient began taking the medication on 02/09/2017. On the morning of 02/12/2017, the patient noted some swelling of his lips that extended to his face. The patient denied any tongue swelling, shortness breath, or difficulty swallowing. He denies any recent new medications, and denies taking anything over-the-counter other than occasional ibuprofen which he has not taken for 3 weeks. He denies any recent travels or eating any unusual, raw or exotic foods.  He denies any new soaps or detergents. He denies any fevers, chills, chest discomfort, short of breath, nausea vomiting, diarrhea, abdominal pain, dysuria, hematuria. Upon presentation, the patient was afebrile and hemodynamically stable with essentially unremarkable BMP and CBC. The patient was made for observation secondary to angioedema and started on intravenous Solu-Medrol, Pepcid, and diphenhydramine.  Assessment/Plan: Angioedema -Secondary to ACE inhibitor -pt has been educated to avoid all ACEi and ARBs -slowly improving -change to po prednisone -advance diet -continue pepcid for now  Essential Hypertension -avoiding ACEi and ARBs -BP remains stable off meds -monitor for now off meds  Poor Dentition -pt states he has been seeing a dentist        Disposition Plan:   Home 6/10 or 6/11 Family Communication:   Mother updated at bedside  Consultants:  none  Code Status:  FULL  DVT Prophylaxis:  SCDs   Procedures: As Listed in Progress Note Above  Antibiotics: None    Subjective: Patient feels that his facial swelling is improving but still has significant lip  swelling. He denies any tongue swelling, dysphagia, odynophagia, chest pain, short of breath. Denies any headache, visual disturbance.  Objective: Vitals:   02/13/17 0300 02/13/17 0400 02/13/17 0500 02/13/17 0600  BP: (!) 147/87 124/80 132/81 136/85  Pulse: 64 61 64 (!) 59  Resp: 14 12 12 12   Temp:  98 F (36.7 C)    TempSrc:  Oral    SpO2: 98% 98% 99% 99%  Weight:   57 kg (125 lb 10.6 oz)   Height:        Intake/Output Summary (Last 24 hours) at 02/13/17 0737 Last data filed at 02/13/17 0600  Gross per 24 hour  Intake            107.5 ml  Output              200 ml  Net            -92.5 ml   Weight change:  Exam:   General:  Pt is alert, follows commands appropriately, not in acute distress  HEENT: No icterus, No thrush, No neck mass, [place]/AT; poor dentition--see picture below for face  Cardiovascular: RRR, S1/S2, no rubs, no gallops  Respiratory: CTA bilaterally, no wheezing, no crackles, no rhonchi  Abdomen: Soft/+BS, non tender, non distended, no guarding  Extremities: No edema, No lymphangitis, No petechiae, No rashes, no synovitis       Data Reviewed: I have personally reviewed following labs and imaging studies Basic Metabolic Panel:  Recent Labs Lab 02/08/17 1052 02/12/17 2104  NA 132* 138  K 3.7 4.8  CL 101 107  CO2 21* 22  GLUCOSE 182* 79  BUN  11 9  CREATININE 0.81 0.75  CALCIUM 8.4* 8.9   Liver Function Tests:  Recent Labs Lab 02/08/17 1052  AST 80*  ALT 47  ALKPHOS 43  BILITOT 1.1  PROT 6.5  ALBUMIN 3.7   No results for input(s): LIPASE, AMYLASE in the last 168 hours. No results for input(s): AMMONIA in the last 168 hours. Coagulation Profile: No results for input(s): INR, PROTIME in the last 168 hours. CBC:  Recent Labs Lab 02/08/17 1052 02/12/17 2104  WBC 5.7 7.2  NEUTROABS 3.1 5.9  HGB 13.2 12.8*  HCT 38.9* 37.7*  MCV 99.5 100.0  PLT 180 201   Cardiac Enzymes: No results for input(s): CKTOTAL, CKMB, CKMBINDEX,  TROPONINI in the last 168 hours. BNP: Invalid input(s): POCBNP CBG: No results for input(s): GLUCAP in the last 168 hours. HbA1C: No results for input(s): HGBA1C in the last 72 hours. Urine analysis:    Component Value Date/Time   COLORURINE YELLOW 06/09/2011 1826   APPEARANCEUR CLEAR 06/09/2011 1826   LABSPEC <1.005 (L) 06/09/2011 1826   PHURINE 6.0 06/09/2011 1826   GLUCOSEU NEGATIVE 06/09/2011 1826   HGBUR NEGATIVE 06/09/2011 1826   BILIRUBINUR NEGATIVE 06/09/2011 1826   KETONESUR NEGATIVE 06/09/2011 1826   PROTEINUR NEGATIVE 06/09/2011 1826   UROBILINOGEN 0.2 06/09/2011 1826   NITRITE NEGATIVE 06/09/2011 1826   LEUKOCYTESUR NEGATIVE 06/09/2011 1826   Sepsis Labs: @LABRCNTIP (procalcitonin:4,lacticidven:4) )No results found for this or any previous visit (from the past 240 hour(s)).   Scheduled Meds: . methylPREDNISolone (SOLU-MEDROL) injection  80 mg Intravenous Q12H  . [START ON 02/14/2017] pneumococcal 23 valent vaccine  0.5 mL Intramuscular Tomorrow-1000  . sodium chloride flush  3 mL Intravenous Q12H   Continuous Infusions: . sodium chloride 10 mL/hr (02/13/17 0015)  . famotidine (PEPCID) IV      Procedures/Studies: Ct Head Wo Contrast  Result Date: 02/08/2017 CLINICAL DATA:  51 year old male with headache for 1 month. Hypertension. EXAM: CT HEAD WITHOUT CONTRAST TECHNIQUE: Contiguous axial images were obtained from the base of the skull through the vertex without intravenous contrast. COMPARISON:  Head CT without contrast 03/03/2007. FINDINGS: Brain: Mild generalized cerebral volume loss since 2008. No midline shift, ventriculomegaly, mass effect, evidence of mass lesion, intracranial hemorrhage or evidence of cortically based acute infarction. Gray-white matter differentiation is within normal limits throughout the brain. Vascular: No suspicious intracranial vascular hyperdensity. Skull: Stable and negative. Sinuses/Orbits: Scattered ethmoid sinus mucosal thickening and  opacification similar to the 2008 study. Mild mucosal thickening now in both sphenoid sinuses. Other visible paranasal sinuses and mastoids are stable and well pneumatized. Other: Visualized orbits and scalp soft tissues are within normal limits. IMPRESSION: 1. Mild generalized cerebral volume loss since 2008 but otherwise normal noncontrast CT appearance of the brain. 2. Mild paranasal sinus inflammation, increased compared to 2008. Electronically Signed   By: Odessa Fleming M.D.   On: 02/08/2017 11:41    Eithan Beagle, DO  [facility] Pager [phone]  If 7PM-7AM, please contact night-coverage [email or web address] Password [email or web address] 02/13/2017, 7:37 AM   LOS: 0 days

## 2017-02-14 LAB — HIV ANTIBODY (ROUTINE TESTING W REFLEX): HIV Screen 4th Generation wRfx: NONREACTIVE

## 2017-04-20 ENCOUNTER — Encounter (INDEPENDENT_AMBULATORY_CARE_PROVIDER_SITE_OTHER): Payer: Self-pay | Admitting: *Deleted

## 2017-04-20 ENCOUNTER — Encounter: Payer: Self-pay | Admitting: Family Medicine

## 2017-04-20 ENCOUNTER — Ambulatory Visit (INDEPENDENT_AMBULATORY_CARE_PROVIDER_SITE_OTHER): Payer: BLUE CROSS/BLUE SHIELD | Admitting: Family Medicine

## 2017-04-20 VITALS — BP 140/86 | HR 80 | Temp 98.6°F | Resp 18 | Ht 66.5 in | Wt 129.1 lb

## 2017-04-20 DIAGNOSIS — Z1211 Encounter for screening for malignant neoplasm of colon: Secondary | ICD-10-CM | POA: Diagnosis not present

## 2017-04-20 DIAGNOSIS — I10 Essential (primary) hypertension: Secondary | ICD-10-CM

## 2017-04-20 DIAGNOSIS — Z720 Tobacco use: Secondary | ICD-10-CM | POA: Diagnosis not present

## 2017-04-20 MED ORDER — AMLODIPINE BESYLATE 5 MG PO TABS: 5.0000 mg | ORAL_TABLET | Freq: Every day | ORAL | 3 refills | Status: DC

## 2017-04-20 NOTE — Patient Instructions (Addendum)
Take the amlodipine every day  I have ordered a colonoscopy  I recommend a flu shot in the fall  Come back in 6 months for a physical       DASH Eating Plan DASH stands for "Dietary Approaches to Stop Hypertension." The DASH eating plan is a healthy eating plan that has been shown to reduce high blood pressure (hypertension). It may also reduce your risk for type 2 diabetes, heart disease, and stroke. The DASH eating plan may also help with weight loss. What are tips for following this plan? General guidelines  Avoid eating more than 2,300 mg (milligrams) of salt (sodium) a day. If you have hypertension, you may need to reduce your sodium intake to 1,500 mg a day.  Limit alcohol intake to no more than 1 drink a day for nonpregnant women and 2 drinks a day for men. One drink equals 12 oz of beer, 5 oz of wine, or 1 oz of hard liquor.  Work with your health care provider to maintain a healthy body weight or to lose weight. Ask what an ideal weight is for you.  Get at least 30 minutes of exercise that causes your heart to beat faster (aerobic exercise) most days of the week. Activities may include walking, swimming, or biking.  Work with your health care provider or diet and nutrition specialist (dietitian) to adjust your eating plan to your individual calorie needs. Reading food labels  Check food labels for the amount of sodium per serving. Choose foods with less than 5 percent of the Daily Value of sodium. Generally, foods with less than 300 mg of sodium per serving fit into this eating plan.  To find whole grains, look for the word "whole" as the first word in the ingredient list. Shopping  Buy products labeled as "low-sodium" or "no salt added."  Buy fresh foods. Avoid canned foods and premade or frozen meals. Cooking  Avoid adding salt when cooking. Use salt-free seasonings or herbs instead of table salt or sea salt. Check with your health care provider or pharmacist before  using salt substitutes.  Do not fry foods. Cook foods using healthy methods such as baking, boiling, grilling, and broiling instead.  Cook with heart-healthy oils, such as olive, canola, soybean, or sunflower oil. Meal planning   Eat a balanced diet that includes: ? 5 or more servings of fruits and vegetables each day. At each meal, try to fill half of your plate with fruits and vegetables. ? Up to 6-8 servings of whole grains each day. ? Less than 6 oz of lean meat, poultry, or fish each day. A 3-oz serving of meat is about the same size as a deck of cards. One egg equals 1 oz. ? 2 servings of low-fat dairy each day. ? A serving of nuts, seeds, or beans 5 times each week. ? Heart-healthy fats. Healthy fats called Omega-3 fatty acids are found in foods such as flaxseeds and coldwater fish, like sardines, salmon, and mackerel.  Limit how much you eat of the following: ? Canned or prepackaged foods. ? Food that is high in trans fat, such as fried foods. ? Food that is high in saturated fat, such as fatty meat. ? Sweets, desserts, sugary drinks, and other foods with added sugar. ? Full-fat dairy products.  Do not salt foods before eating.  Try to eat at least 2 vegetarian meals each week.  Eat more home-cooked food and less restaurant, buffet, and fast food.  When eating at  a restaurant, ask that your food be prepared with less salt or no salt, if possible. What foods are recommended? The items listed may not be a complete list. Talk with your dietitian about what dietary choices are best for you. Grains Whole-grain or whole-wheat bread. Whole-grain or whole-wheat pasta. Brown rice. Orpah Cobb. Bulgur. Whole-grain and low-sodium cereals. Pita bread. Low-fat, low-sodium crackers. Whole-wheat flour tortillas. Vegetables Fresh or frozen vegetables (raw, steamed, roasted, or grilled). Low-sodium or reduced-sodium tomato and vegetable juice. Low-sodium or reduced-sodium tomato sauce  and tomato paste. Low-sodium or reduced-sodium canned vegetables. Fruits All fresh, dried, or frozen fruit. Canned fruit in natural juice (without added sugar). Meat and other protein foods Skinless chicken or [place]. Ground chicken or [place]. Pork with fat trimmed off. Fish and seafood. Egg whites. Dried beans, peas, or lentils. Unsalted nuts, nut butters, and seeds. Unsalted canned beans. Lean cuts of beef with fat trimmed off. Low-sodium, lean deli meat. Dairy Low-fat (1%) or fat-free (skim) milk. Fat-free, low-fat, or reduced-fat cheeses. Nonfat, low-sodium ricotta or cottage cheese. Low-fat or nonfat yogurt. Low-fat, low-sodium cheese. Fats and oils Soft margarine without trans fats. Vegetable oil. Low-fat, reduced-fat, or light mayonnaise and salad dressings (reduced-sodium). Canola, safflower, olive, soybean, and sunflower oils. Avocado. Seasoning and other foods Herbs. Spices. Seasoning mixes without salt. Unsalted popcorn and pretzels. Fat-free sweets. What foods are not recommended? The items listed may not be a complete list. Talk with your dietitian about what dietary choices are best for you. Grains Baked goods made with fat, such as croissants, muffins, or some breads. Dry pasta or rice meal packs. Vegetables Creamed or fried vegetables. Vegetables in a cheese sauce. Regular canned vegetables (not low-sodium or reduced-sodium). Regular canned tomato sauce and paste (not low-sodium or reduced-sodium). Regular tomato and vegetable juice (not low-sodium or reduced-sodium). Rosita Fire. Olives. Fruits Canned fruit in a light or heavy syrup. Fried fruit. Fruit in cream or butter sauce. Meat and other protein foods Fatty cuts of meat. Ribs. Fried meat. Tomasa Blase. Sausage. Bologna and other processed lunch meats. Salami. Fatback. Hotdogs. Bratwurst. Salted nuts and seeds. Canned beans with added salt. Canned or smoked fish. Whole eggs or egg yolks. Chicken or [place] with skin. Dairy Whole or 2%  milk, cream, and half-and-half. Whole or full-fat cream cheese. Whole-fat or sweetened yogurt. Full-fat cheese. Nondairy creamers. Whipped toppings. Processed cheese and cheese spreads. Fats and oils Butter. Stick margarine. Lard. Shortening. Ghee. Bacon fat. Tropical oils, such as coconut, palm kernel, or palm oil. Seasoning and other foods Salted popcorn and pretzels. Onion salt, garlic salt, seasoned salt, table salt, and sea salt. Worcestershire sauce. Tartar sauce. Barbecue sauce. Teriyaki sauce. Soy sauce, including reduced-sodium. Steak sauce. Canned and packaged gravies. Fish sauce. Oyster sauce. Cocktail sauce. Horseradish that you find on the shelf. Ketchup. Mustard. Meat flavorings and tenderizers. Bouillon cubes. Hot sauce and Tabasco sauce. Premade or packaged marinades. Premade or packaged taco seasonings. Relishes. Regular salad dressings. Where to find more information:  National Heart, Lung, and Blood Institute: PopSteam.is  American Heart Association: www.heart.org Summary  The DASH eating plan is a healthy eating plan that has been shown to reduce high blood pressure (hypertension). It may also reduce your risk for type 2 diabetes, heart disease, and stroke.  With the DASH eating plan, you should limit salt (sodium) intake to 2,300 mg a day. If you have hypertension, you may need to reduce your sodium intake to 1,500 mg a day.  When on the DASH eating plan, aim to eat  more fresh fruits and vegetables, whole grains, lean proteins, low-fat dairy, and heart-healthy fats.  Work with your health care provider or diet and nutrition specialist (dietitian) to adjust your eating plan to your individual calorie needs. This information is not intended to replace advice given to you by your health care provider. Make sure you discuss any questions you have with your health care provider. Document Released: 08/12/2011 Document Revised: 08/16/2016 Document Reviewed:  08/16/2016 Elsevier Interactive Patient Education  2017 [employer]

## 2017-04-20 NOTE — Progress Notes (Signed)
Chief Complaint  Patient presents with  . Hypertension   Recent diagnosis of hypertension initally placed on lisinopril - unfortunately allergic Now on amlodipine, tolerates, but ran out Smoker.  I have discussed the multiple health risks associated with cigarette smoking including, but not limited to, cardiovascular disease, lung disease and cancer.  I have strongly recommended that smoking be stopped.  I have reviewed the various methods of quitting including cold [place], classes, nicotine replacements and prescription medications.  I have offered assistance in this difficult process.  The patient is not interested in assistance at this time. Reviewed CVD risk factors History of back pain - none now No prior PCP or prevention.   Patient Active Problem List   Diagnosis Date Noted  . Tobacco abuse 04/20/2017  . Angioedema due to angiotensin converting enzyme inhibitor (ACE-I)   . Chronic back pain   . Hypertension     Outpatient Encounter Prescriptions as of 04/20/2017  Medication Sig  . amLODipine (NORVASC) 5 MG tablet Take 1 tablet (5 mg total) by mouth daily.   No facility-administered encounter medications on file as of 04/20/2017.     Past Medical History:  Diagnosis Date  . Allergy   . Asthma    as a child  . Chronic back pain   . Hypertension     Past Surgical History:  Procedure Laterality Date  . HAND SURGERY     left  . HAND SURGERY      Social History   Social History  . Marital status: Single    Spouse name: N/A  . Number of children: 0  . Years of education: 58   Occupational History  . Administrator, [employer] Service, forklift    Social History Main Topics  . Smoking status: Current Every Day Smoker    Packs/day: 1.00    Types: Cigarettes    Start date: 09/06/1993  . Smokeless tobacco: Never Used  . Alcohol use Yes     Comment: beer, 2 cans daily  . Drug use: Yes    Types: Marijuana     Comment: former  . Sexual activity: Yes    Birth control/  protection: None   Other Topics Concern  . Not on file   Social History Narrative   Lives with girlfriend       Family History  Problem Relation Age of Onset  . Asthma Mother   . Hypertension Mother   . Hypertension Father   . Kidney disease Brother   . Stroke Brother   . Hypertension Brother     Review of Systems  Constitutional: Negative for chills, fever and weight loss.  HENT: Negative for congestion and hearing loss.   Eyes: Negative for blurred vision and pain.  Respiratory: Negative for cough and shortness of breath.   Cardiovascular: Negative for chest pain and leg swelling.  Gastrointestinal: Negative for abdominal pain, constipation, diarrhea and heartburn.  Genitourinary: Negative for dysuria and frequency.  Musculoskeletal: Negative for falls, joint pain and myalgias.  Neurological: Negative for dizziness, seizures and headaches.  Psychiatric/Behavioral: Negative for depression. The patient is not nervous/anxious and does not have insomnia.     BP 140/86 (BP Location: Right Arm, Patient Position: Sitting, Cuff Size: Normal)   Pulse 80   Temp 98.6 F (37 C) (Temporal)   Resp 18   Ht 5' 6.5" (1.689 m)   Wt 129 lb 1.9 oz (58.6 kg)   SpO2 99%   BMI 20.53 kg/m   Physical Exam  Constitutional:  He is oriented to person, place, and time. He appears well-developed and well-nourished.  HENT:  Head: Normocephalic and atraumatic.  Right Ear: External ear normal.  Left Ear: External ear normal.  Mouth/Throat: Oropharynx is clear and moist.  Needs dental repair  Eyes: Pupils are equal, round, and reactive to light. Conjunctivae are normal.  Neck: Normal range of motion. Neck supple. No thyromegaly present.  Cardiovascular: Normal rate, regular rhythm and normal heart sounds.   Pulmonary/Chest: Effort normal and breath sounds normal. No respiratory distress.  Abdominal: Soft. Bowel sounds are normal.  Musculoskeletal: Normal range of motion. He exhibits no edema.   Lymphadenopathy:    He has no cervical adenopathy.  Neurological: He is alert and oriented to person, place, and time.  Gait normal  Skin: Skin is warm and dry.  Psychiatric: He has a normal mood and affect. His behavior is normal. Thought content normal.  Nursing note and vitals reviewed.  ASSESSMENT/PLAN:  1. Essential hypertension - CBC - Lipid panel - Urinalysis, Routine w reflex microscopic - COMPLETE METABOLIC PANEL WITH GFR  2. Tobacco abuse  3. Screen for colon cancer - Ambulatory referral to Gastroenterology   Patient Instructions  Take the amlodipine every day  I have ordered a colonoscopy  I recommend a flu shot in the fall  Come back in 6 months for a physical       DASH Eating Plan DASH stands for "Dietary Approaches to Stop Hypertension." The DASH eating plan is a healthy eating plan that has been shown to reduce high blood pressure (hypertension). It may also reduce your risk for type 2 diabetes, heart disease, and stroke. The DASH eating plan may also help with weight loss. What are tips for following this plan? General guidelines  Avoid eating more than 2,300 mg (milligrams) of salt (sodium) a day. If you have hypertension, you may need to reduce your sodium intake to 1,500 mg a day.  Limit alcohol intake to no more than 1 drink a day for nonpregnant women and 2 drinks a day for men. One drink equals 12 oz of beer, 5 oz of wine, or 1 oz of hard liquor.  Work with your health care provider to maintain a healthy body weight or to lose weight. Ask what an ideal weight is for you.  Get at least 30 minutes of exercise that causes your heart to beat faster (aerobic exercise) most days of the week. Activities may include walking, swimming, or biking.  Work with your health care provider or diet and nutrition specialist (dietitian) to adjust your eating plan to your individual calorie needs. Reading food labels  Check food labels for the amount of  sodium per serving. Choose foods with less than 5 percent of the Daily Value of sodium. Generally, foods with less than 300 mg of sodium per serving fit into this eating plan.  To find whole grains, look for the word "whole" as the first word in the ingredient list. Shopping  Buy products labeled as "low-sodium" or "no salt added."  Buy fresh foods. Avoid canned foods and premade or frozen meals. Cooking  Avoid adding salt when cooking. Use salt-free seasonings or herbs instead of table salt or sea salt. Check with your health care provider or pharmacist before using salt substitutes.  Do not fry foods. Cook foods using healthy methods such as baking, boiling, grilling, and broiling instead.  Cook with heart-healthy oils, such as olive, canola, soybean, or sunflower oil. Meal planning   Eat  a balanced diet that includes: ? 5 or more servings of fruits and vegetables each day. At each meal, try to fill half of your plate with fruits and vegetables. ? Up to 6-8 servings of whole grains each day. ? Less than 6 oz of lean meat, poultry, or fish each day. A 3-oz serving of meat is about the same size as a deck of cards. One egg equals 1 oz. ? 2 servings of low-fat dairy each day. ? A serving of nuts, seeds, or beans 5 times each week. ? Heart-healthy fats. Healthy fats called Omega-3 fatty acids are found in foods such as flaxseeds and coldwater fish, like sardines, salmon, and mackerel.  Limit how much you eat of the following: ? Canned or prepackaged foods. ? Food that is high in trans fat, such as fried foods. ? Food that is high in saturated fat, such as fatty meat. ? Sweets, desserts, sugary drinks, and other foods with added sugar. ? Full-fat dairy products.  Do not salt foods before eating.  Try to eat at least 2 vegetarian meals each week.  Eat more home-cooked food and less restaurant, buffet, and fast food.  When eating at a restaurant, ask that your food be prepared with  less salt or no salt, if possible. What foods are recommended? The items listed may not be a complete list. Talk with your dietitian about what dietary choices are best for you. Grains Whole-grain or whole-wheat bread. Whole-grain or whole-wheat pasta. Brown rice. [place]. Bulgur. Whole-grain and low-sodium cereals. Pita bread. Low-fat, low-sodium crackers. Whole-wheat flour tortillas. Vegetables Fresh or frozen vegetables (raw, steamed, roasted, or grilled). Low-sodium or reduced-sodium tomato and vegetable juice. Low-sodium or reduced-sodium tomato sauce and tomato paste. Low-sodium or reduced-sodium canned vegetables. Fruits All fresh, dried, or frozen fruit. Canned fruit in natural juice (without added sugar). Meat and other protein foods Skinless chicken or [place]. Ground chicken or [place]. Pork with fat trimmed off. Fish and seafood. Egg whites. Dried beans, peas, or lentils. Unsalted nuts, nut butters, and seeds. Unsalted canned beans. Lean cuts of beef with fat trimmed off. Low-sodium, lean deli meat. Dairy Low-fat (1%) or fat-free (skim) milk. Fat-free, low-fat, or reduced-fat cheeses. Nonfat, low-sodium ricotta or cottage cheese. Low-fat or nonfat yogurt. Low-fat, low-sodium cheese. Fats and oils Soft margarine without trans fats. Vegetable oil. Low-fat, reduced-fat, or light mayonnaise and salad dressings (reduced-sodium). Canola, safflower, olive, soybean, and sunflower oils. Avocado. Seasoning and other foods Herbs. Spices. Seasoning mixes without salt. Unsalted popcorn and pretzels. Fat-free sweets. What foods are not recommended? The items listed may not be a complete list. Talk with your dietitian about what dietary choices are best for you. Grains Baked goods made with fat, such as croissants, muffins, or some breads. Dry pasta or rice meal packs. Vegetables Creamed or fried vegetables. Vegetables in a cheese sauce. Regular canned vegetables (not low-sodium or  reduced-sodium). Regular canned tomato sauce and paste (not low-sodium or reduced-sodium). Regular tomato and vegetable juice (not low-sodium or reduced-sodium). Angie Fava. Olives. Fruits Canned fruit in a light or heavy syrup. Fried fruit. Fruit in cream or butter sauce. Meat and other protein foods Fatty cuts of meat. Ribs. Fried meat. Berniece Salines. Sausage. Bologna and other processed lunch meats. Salami. Fatback. Hotdogs. Bratwurst. Salted nuts and seeds. Canned beans with added salt. Canned or smoked fish. Whole eggs or egg yolks. Chicken or [place] with skin. Dairy Whole or 2% milk, cream, and half-and-half. Whole or full-fat cream cheese. Whole-fat or sweetened yogurt. Full-fat  cheese. Nondairy creamers. Whipped toppings. Processed cheese and cheese spreads. Fats and oils Butter. Stick margarine. Lard. Shortening. Ghee. Bacon fat. Tropical oils, such as coconut, palm kernel, or palm oil. Seasoning and other foods Salted popcorn and pretzels. Onion salt, garlic salt, seasoned salt, table salt, and sea salt. Worcestershire sauce. Tartar sauce. Barbecue sauce. Teriyaki sauce. Soy sauce, including reduced-sodium. Steak sauce. Canned and packaged gravies. Fish sauce. Oyster sauce. Cocktail sauce. Horseradish that you find on the shelf. Ketchup. Mustard. Meat flavorings and tenderizers. Bouillon cubes. Hot sauce and Tabasco sauce. Premade or packaged marinades. Premade or packaged taco seasonings. Relishes. Regular salad dressings. Where to find more information:  National Heart, Lung, and Blood Institute: PopSteam.is  American Heart Association: www.heart.org Summary  The DASH eating plan is a healthy eating plan that has been shown to reduce high blood pressure (hypertension). It may also reduce your risk for type 2 diabetes, heart disease, and stroke.  With the DASH eating plan, you should limit salt (sodium) intake to 2,300 mg a day. If you have hypertension, you may need to reduce your sodium  intake to 1,500 mg a day.  When on the DASH eating plan, aim to eat more fresh fruits and vegetables, whole grains, lean proteins, low-fat dairy, and heart-healthy fats.  Work with your health care provider or diet and nutrition specialist (dietitian) to adjust your eating plan to your individual calorie needs. This information is not intended to replace advice given to you by your health care provider. Make sure you discuss any questions you have with your health care provider. Document Released: 08/12/2011 Document Revised: 08/16/2016 Document Reviewed: 08/16/2016 Elsevier Interactive Patient Education  2017 Elsevier Inc.    Eustace Moore, MD

## 2017-10-21 ENCOUNTER — Ambulatory Visit: Payer: BLUE CROSS/BLUE SHIELD | Admitting: Family Medicine

## 2017-11-09 ENCOUNTER — Encounter: Payer: Self-pay | Admitting: Family Medicine

## 2017-11-09 ENCOUNTER — Ambulatory Visit (INDEPENDENT_AMBULATORY_CARE_PROVIDER_SITE_OTHER): Payer: BLUE CROSS/BLUE SHIELD | Admitting: Family Medicine

## 2017-11-09 VITALS — BP 132/84 | HR 81 | Temp 98.2°F | Ht 66.5 in | Wt 131.2 lb

## 2017-11-09 DIAGNOSIS — Z1211 Encounter for screening for malignant neoplasm of colon: Secondary | ICD-10-CM

## 2017-11-09 DIAGNOSIS — Z720 Tobacco use: Secondary | ICD-10-CM | POA: Diagnosis not present

## 2017-11-09 DIAGNOSIS — I10 Essential (primary) hypertension: Secondary | ICD-10-CM

## 2017-11-09 NOTE — Patient Instructions (Signed)
You need blood tests today I will send you a letter with your test results.  If there is anything of concern, we will call right away.  Continue to reduce and hopefully quit smoking  Need follow up in six months We will contact you about appointment

## 2017-11-09 NOTE — Progress Notes (Signed)
Chief Complaint  Patient presents with  . Follow-up   Patient is here for six-month follow-up. He is feeling well.  Works full duty.  No chest pain shortness of breath.  No problems with his medicine.  Compliant with medicine daily.  Blood pressure well controlled. He has never had his cholesterol checked.  It is appropriate to do.  He is going for labs today. He continues to smoke cigarettes.  He is cut his tobacco intake in half since I saw him last.  He is working on quitting.  He does not feel like he needs assistance.  He is congratulated on this effort. He has never been screened for colon cancer.  There is no colon cancer in his family.  He is a low risk individual.  I will order cologuard  Patient Active Problem List   Diagnosis Date Noted  . Tobacco abuse 04/20/2017  . Angioedema due to angiotensin converting enzyme inhibitor (ACE-I)   . Chronic back pain   . Hypertension     Outpatient Encounter Medications as of 11/09/2017  Medication Sig  . amLODipine (NORVASC) 5 MG tablet Take 1 tablet (5 mg total) by mouth daily.   No facility-administered encounter medications on file as of 11/09/2017.     Allergies  Allergen Reactions  . Lisinopril Swelling    Angioedema     Review of Systems  Constitutional: Negative for activity change, appetite change and fatigue.  HENT: Negative for congestion and dental problem.   Eyes: Negative for photophobia and visual disturbance.  Respiratory: Negative for cough and shortness of breath.   Cardiovascular: Negative for chest pain, palpitations and leg swelling.  Gastrointestinal: Negative for constipation and diarrhea.  Genitourinary: Negative for difficulty urinating, flank pain and frequency.  Musculoskeletal: Negative for arthralgias and back pain.  Neurological: Negative for light-headedness and headaches.  Psychiatric/Behavioral: Negative for dysphoric mood. The patient is not nervous/anxious.     BP 132/84 (BP Location:  Left Arm, Patient Position: Sitting, Cuff Size: Normal)   Pulse 81   Temp 98.2 F (36.8 C) (Temporal)   Ht 5' 6.5" (1.689 m)   Wt 131 lb 4 oz (59.5 kg)   SpO2 97%   BMI 20.87 kg/m   Physical Exam  Constitutional: He is oriented to person, place, and time. He appears well-developed and well-nourished.  Smells of tobacco  HENT:  Head: Normocephalic and atraumatic.  Right Ear: External ear normal.  Left Ear: External ear normal.  Mouth/Throat: Oropharynx is clear and moist.  Needs dental repair  Eyes: Conjunctivae are normal. Pupils are equal, round, and reactive to light.  Neck: Normal range of motion. Neck supple. No thyromegaly present.  Cardiovascular: Normal rate, regular rhythm and normal heart sounds.  Pulmonary/Chest: Effort normal and breath sounds normal. No respiratory distress.  Abdominal: Soft. Bowel sounds are normal.  Musculoskeletal: Normal range of motion. He exhibits no edema.  Lymphadenopathy:    He has no cervical adenopathy.  Neurological: He is alert and oriented to person, place, and time.  Gait normal  Skin: Skin is warm and dry.  Psychiatric: He has a normal mood and affect. His behavior is normal. Thought content normal.  Nursing note and vitals reviewed.   ASSESSMENT/PLAN:  1. Essential hypertension Well-controlled. 2.  Tobacco abuse 3.  Screen for colon cancer Cologuard ordered.  Patient Instructions  You need blood tests today I will send you a letter with your test results.  If there is anything of concern, we will call  right away.  Continue to reduce and hopefully quit smoking  Need follow up in six months We will contact you about appointment   Eustace Moore, MD

## 2017-11-10 LAB — URINALYSIS, ROUTINE W REFLEX MICROSCOPIC
Bacteria, UA: NONE SEEN /HPF
Bilirubin Urine: NEGATIVE
Glucose, UA: NEGATIVE
Hgb urine dipstick: NEGATIVE
Hyaline Cast: NONE SEEN /LPF
Nitrite: NEGATIVE
Protein, ur: NEGATIVE
RBC / HPF: NONE SEEN /HPF (ref 0–2)
Specific Gravity, Urine: 1.02 (ref 1.001–1.03)
Squamous Epithelial / LPF: NONE SEEN /HPF (ref <=5)
pH: 5.5 (ref 5.0–8.0)

## 2017-11-10 LAB — CBC
HCT: 40.3 % (ref 38.5–50.0)
Hemoglobin: 14.1 g/dL (ref 13.2–17.1)
MCH: 34.6 pg — ABNORMAL HIGH (ref 27.0–33.0)
MCHC: 35 g/dL (ref 32.0–36.0)
MCV: 98.8 fL (ref 80.0–100.0)
MPV: 9.4 fL (ref 7.5–12.5)
Platelets: 210 10*3/uL (ref 140–400)
RBC: 4.08 10*6/uL — ABNORMAL LOW (ref 4.20–5.80)
RDW: 12 % (ref 11.0–15.0)
WBC: 6.8 10*3/uL (ref 3.8–10.8)

## 2017-11-10 LAB — LIPID PANEL
Cholesterol: 132 mg/dL (ref <200)
HDL: 86 mg/dL (ref >40)
LDL Cholesterol (Calc): 32 mg/dL (calc)
Non-HDL Cholesterol (Calc): 46 mg/dL (calc) (ref <130)
Total CHOL/HDL Ratio: 1.5 (calc) (ref <5.0)
Triglycerides: 60 mg/dL (ref <150)

## 2017-11-10 LAB — COMPLETE METABOLIC PANEL WITH GFR
AG Ratio: 1.8 (calc) (ref 1.0–2.5)
ALT: 18 U/L (ref 9–46)
AST: 34 U/L (ref 10–35)
Albumin: 4.2 g/dL (ref 3.6–5.1)
Alkaline phosphatase (APISO): 56 U/L (ref 40–115)
BUN: 7 mg/dL (ref 7–25)
CO2: 25 mmol/L (ref 20–32)
Calcium: 8.7 mg/dL (ref 8.6–10.3)
Chloride: 108 mmol/L (ref 98–110)
Creat: 0.77 mg/dL (ref 0.70–1.33)
GFR, Est African American: 122 mL/min/{1.73_m2} (ref >=60)
GFR, Est Non African American: 105 mL/min/{1.73_m2} (ref >=60)
Globulin: 2.4 g/dL (calc) (ref 1.9–3.7)
Glucose, Bld: 89 mg/dL (ref 65–99)
Potassium: 3.8 mmol/L (ref 3.5–5.3)
Sodium: 142 mmol/L (ref 135–146)
Total Bilirubin: 0.7 mg/dL (ref 0.2–1.2)
Total Protein: 6.6 g/dL (ref 6.1–8.1)

## 2017-11-14 ENCOUNTER — Encounter: Payer: Self-pay | Admitting: Family Medicine

## 2018-02-10 ENCOUNTER — Encounter: Payer: Self-pay | Admitting: Family Medicine

## 2018-03-16 ENCOUNTER — Encounter: Payer: Self-pay | Admitting: Family Medicine

## 2018-04-21 ENCOUNTER — Encounter: Payer: Self-pay | Admitting: Family Medicine

## 2018-05-12 ENCOUNTER — Ambulatory Visit: Payer: BLUE CROSS/BLUE SHIELD | Admitting: Family Medicine

## 2018-08-06 ENCOUNTER — Other Ambulatory Visit: Payer: Self-pay

## 2018-08-06 ENCOUNTER — Emergency Department (HOSPITAL_COMMUNITY)
Admission: EM | Admit: 2018-08-06 | Discharge: 2018-08-06 | Disposition: A | Discharge status: Home / Self Care | Payer: BLUE CROSS/BLUE SHIELD | Attending: Emergency Medicine | Admitting: Emergency Medicine

## 2018-08-06 ENCOUNTER — Encounter (HOSPITAL_COMMUNITY): Payer: Self-pay | Admitting: *Deleted

## 2018-08-06 ENCOUNTER — Emergency Department (HOSPITAL_COMMUNITY): Payer: BLUE CROSS/BLUE SHIELD

## 2018-08-06 DIAGNOSIS — F17210 Nicotine dependence, cigarettes, uncomplicated: Secondary | ICD-10-CM | POA: Insufficient documentation

## 2018-08-06 DIAGNOSIS — J069 Acute upper respiratory infection, unspecified: Secondary | ICD-10-CM

## 2018-08-06 DIAGNOSIS — I10 Essential (primary) hypertension: Secondary | ICD-10-CM | POA: Diagnosis not present

## 2018-08-06 DIAGNOSIS — R05 Cough: Secondary | ICD-10-CM | POA: Diagnosis present

## 2018-08-06 DIAGNOSIS — J45909 Unspecified asthma, uncomplicated: Secondary | ICD-10-CM | POA: Diagnosis not present

## 2018-08-06 DIAGNOSIS — R079 Chest pain, unspecified: Secondary | ICD-10-CM

## 2018-08-06 LAB — CBC WITH DIFFERENTIAL/PLATELET
Abs Immature Granulocytes: 0.02 10*3/uL (ref 0.00–0.07)
Basophils Absolute: 0.1 10*3/uL (ref 0.0–0.1)
Basophils Relative: 1 %
Eosinophils Absolute: 0.2 10*3/uL (ref 0.0–0.5)
Eosinophils Relative: 3 %
HCT: 42.9 % (ref 39.0–52.0)
Hemoglobin: 14.4 g/dL (ref 13.0–17.0)
Immature Granulocytes: 0 %
Lymphocytes Relative: 28 %
Lymphs Abs: 2.2 10*3/uL (ref 0.7–4.0)
MCH: 34.1 pg — ABNORMAL HIGH (ref 26.0–34.0)
MCHC: 33.6 g/dL (ref 30.0–36.0)
MCV: 101.7 fL — ABNORMAL HIGH (ref 80.0–100.0)
Monocytes Absolute: 0.6 10*3/uL (ref 0.1–1.0)
Monocytes Relative: 8 %
Neutro Abs: 4.6 10*3/uL (ref 1.7–7.7)
Neutrophils Relative %: 60 %
Platelets: 189 10*3/uL (ref 150–400)
RBC: 4.22 MIL/uL (ref 4.22–5.81)
RDW: 12.2 % (ref 11.5–15.5)
WBC: 7.7 10*3/uL (ref 4.0–10.5)
nRBC: 0 % (ref 0.0–0.2)

## 2018-08-06 LAB — TROPONIN I: Troponin I: <0.03 ng/mL (ref <0.03)

## 2018-08-06 LAB — BASIC METABOLIC PANEL
Anion gap: 7 (ref 5–15)
BUN: 13 mg/dL (ref 6–20)
CO2: 26 mmol/L (ref 22–32)
Calcium: 9.2 mg/dL (ref 8.9–10.3)
Chloride: 103 mmol/L (ref 98–111)
Creatinine, Ser: 0.7 mg/dL (ref 0.61–1.24)
GFR calc Af Amer: >60 mL/min (ref >60)
GFR calc non Af Amer: >60 mL/min (ref >60)
Glucose, Bld: 116 mg/dL — ABNORMAL HIGH (ref 70–99)
Potassium: 4.4 mmol/L (ref 3.5–5.1)
Sodium: 136 mmol/L (ref 135–145)

## 2018-08-06 MED ORDER — BENZONATATE 100 MG PO CAPS: 100.0000 mg | ORAL_CAPSULE | Freq: Three times a day (TID) | ORAL | 0 refills | Status: DC | PRN

## 2018-08-06 MED ORDER — AMLODIPINE BESYLATE 5 MG PO TABS: 5.0000 mg | ORAL_TABLET | Freq: Every day | ORAL | 0 refills | Status: DC

## 2018-08-06 NOTE — ED Triage Notes (Signed)
Chills, dry cough, body aches, denies fever since Monday.

## 2018-08-06 NOTE — ED Notes (Signed)
ED Provider at bedside. 

## 2018-08-06 NOTE — ED Triage Notes (Signed)
Acuity changed to 3 due to HTN.  Pt states he has ran out of medication for HTN.

## 2018-08-06 NOTE — ED Provider Notes (Signed)
[facility] Provider Note   CSN: [id number] Arrival date & time: 08/06/18  1119     History   Chief Complaint Chief Complaint  Patient presents with  . URI    HPI Mike Keller is a 52 y.o. male.  HPI With URI symptoms for the last week.  States he has had chills, nonproductive cough and diffuse body aches.  Began having central chest pain on Thursday.  Was not worse with deep breathing or coughing.  Chest pain is since resolved.  This prompted his visit today for evaluation of the chest pain.  States he has been out of his blood pressure medication for some time.  He denies headaches or focal weakness.  No new lower extremity swelling or pain. Past Medical History:  Diagnosis Date  . Allergy   . Asthma    as a child  . Chronic back pain   . Hypertension     Patient Active Problem List   Diagnosis Date Noted  . Tobacco abuse 04/20/2017  . Angioedema due to angiotensin converting enzyme inhibitor (ACE-I)   . Chronic back pain   . Hypertension     Past Surgical History:  Procedure Laterality Date  . HAND SURGERY     left  . HAND SURGERY          Home Medications    Prior to Admission medications   Medication Sig Start Date End Date Taking? Authorizing Provider  amLODipine (NORVASC) 5 MG tablet Take 1 tablet (5 mg total) by mouth daily. 08/06/18   Loren RacerYelverton, Arretta Toenjes, MD  benzonatate (TESSALON) 100 MG capsule Take 1 capsule (100 mg total) by mouth 3 (three) times daily as needed for cough. 08/06/18   Loren RacerYelverton, Cornell Bourbon, MD    Family History Family History  Problem Relation Age of Onset  . Asthma Mother   . Hypertension Mother   . Hypertension Father   . Kidney disease Brother   . Stroke Brother   . Hypertension Brother     Social History Social History   Tobacco Use  . Smoking status: Current Every Day Smoker    Packs/day: 1.00    Types: Cigarettes    Start date: 09/06/1993  . Smokeless tobacco: Never Used  Substance Use Topics  .  Alcohol use: Yes    Comment: beer, 2 cans daily  . Drug use: Yes    Types: Marijuana     Allergies   Lisinopril   Review of Systems Review of Systems  Constitutional: Positive for chills and fatigue. Negative for fever.  HENT: Negative for trouble swallowing and voice change.   Respiratory: Positive for cough. Negative for shortness of breath.   Cardiovascular: Positive for chest pain. Negative for palpitations and leg swelling.  Gastrointestinal: Negative for abdominal pain, constipation, diarrhea, nausea and vomiting.  Genitourinary: Negative for dysuria, flank pain, frequency and hematuria.  Musculoskeletal: Positive for myalgias. Negative for back pain and neck pain.  Skin: Negative for rash and wound.  Neurological: Negative for dizziness, weakness, light-headedness, numbness and headaches.  All other systems reviewed and are negative.    Physical Exam Updated Vital Signs BP (!) 190/99 (BP Location: Right Arm) Comment: pt states ran out of HTn med  Pulse 82   Temp 98.1 F (36.7 C) (Temporal) Comment: unable to obtain oral due to pt taking sip of drink  Resp 16   Ht 5\' 7"  (1.702 m)   Wt 61.2 kg   SpO2 99%   BMI 21.14 kg/m  Physical Exam  Constitutional: He is oriented to person, place, and time. He appears well-developed and well-nourished. No distress.  HENT:  Head: Normocephalic and atraumatic.  Mouth/Throat: Oropharynx is clear and moist. No oropharyngeal exudate.  Eyes: Pupils are equal, round, and reactive to light. EOM are normal.  Neck: Normal range of motion. Neck supple. No JVD present.  Cardiovascular: Normal rate and regular rhythm. Exam reveals no gallop and no friction rub.  No murmur heard. Pulmonary/Chest: Effort normal and breath sounds normal. No stridor. No respiratory distress. He has no wheezes. He has no rales. He exhibits no tenderness.  Abdominal: Soft. Bowel sounds are normal. There is no tenderness. There is no rebound and no guarding.    Musculoskeletal: Normal range of motion. He exhibits no edema or tenderness.  No midline thoracic or lumbar tenderness.  No CVA tenderness.  No lower extremity swelling, asymmetry or tenderness.  Pulses are 2+.  Lymphadenopathy:    He has no cervical adenopathy.  Neurological: He is alert and oriented to person, place, and time.  Moving all extremities without focal deficit.  Sensation fully intact.  Skin: Skin is warm and dry. No rash noted. He is not diaphoretic. No erythema.  Psychiatric: He has a normal mood and affect. His behavior is normal.  Nursing note and vitals reviewed.    ED Treatments / Results  Labs (all labs ordered are listed, but only abnormal results are displayed) Labs Reviewed  CBC WITH DIFFERENTIAL/PLATELET - Abnormal; Notable for the following components:      Result Value   MCV 101.7 (*)    MCH 34.1 (*)    All other components within normal limits  BASIC METABOLIC PANEL - Abnormal; Notable for the following components:   Glucose, Bld 116 (*)    All other components within normal limits  TROPONIN I    EKG EKG Interpretation  Date/Time:  "Sunday August 06 2018 15:28:42 EST Ventricular Rate:  65 PR Interval:    QRS Duration: 73 QT Interval:  395 QTC Calculation: 411 R Axis:   70 Text Interpretation:  Sinus rhythm Confirmed by Wickline, Donald ([id number]) on 08/07/2018 8:19:50 AM   Radiology Dg Chest 2 View  Result Date: 08/06/2018 CLINICAL DATA:  Cough EXAM: CHEST - 2 VIEW COMPARISON:  07/09/2013 chest radiograph. FINDINGS: Stable cardiomediastinal silhouette with normal heart size. No pneumothorax. No pleural effusion. Lungs appear clear, with no acute consolidative airspace disease and no pulmonary edema. IMPRESSION: No active cardiopulmonary disease. Electronically Signed   By: Jason A Poff M.D.   On: 08/06/2018 12:57    Procedures Procedures (including critical care time)  Medications Ordered in ED Medications - No data to display   Initial  Impression / Assessment and Plan / ED Course  I have reviewed the triage vital signs and the nursing notes.  Pertinent labs & imaging results that were available during my care of the patient were reviewed by me and considered in my medical decision making (see chart for details).    Patient is chest pain-free.  Single troponin sufficient to rule out acute injury.  Refilled patient's blood pressure medication.  Return precautions have been given.   Final Clinical Impressions(s) / ED Diagnoses   Final diagnoses:  Viral upper respiratory tract infection  Nonspecific chest pain  Hypertension, unspecified type    ED Discharge Orders         Ordered    amLODipine (NORVASC) 5 MG tablet  Daily     12" /01/19 1611  benzonatate (TESSALON) 100 MG capsule  3 times daily PRN     08/06/18 1611           Loren Racer, MD 08/07/18 1647

## 2018-09-01 ENCOUNTER — Emergency Department (HOSPITAL_COMMUNITY)
Admission: EM | Admit: 2018-09-01 | Discharge: 2018-09-01 | Disposition: A | Discharge status: Home / Self Care | Payer: BLUE CROSS/BLUE SHIELD | Attending: Emergency Medicine | Admitting: Emergency Medicine

## 2018-09-01 ENCOUNTER — Other Ambulatory Visit: Payer: Self-pay

## 2018-09-01 ENCOUNTER — Emergency Department (HOSPITAL_COMMUNITY): Payer: BLUE CROSS/BLUE SHIELD

## 2018-09-01 ENCOUNTER — Encounter (HOSPITAL_COMMUNITY): Payer: Self-pay

## 2018-09-01 DIAGNOSIS — J45909 Unspecified asthma, uncomplicated: Secondary | ICD-10-CM | POA: Diagnosis not present

## 2018-09-01 DIAGNOSIS — I10 Essential (primary) hypertension: Secondary | ICD-10-CM | POA: Diagnosis not present

## 2018-09-01 DIAGNOSIS — M25561 Pain in right knee: Secondary | ICD-10-CM

## 2018-09-01 DIAGNOSIS — R251 Tremor, unspecified: Secondary | ICD-10-CM | POA: Diagnosis not present

## 2018-09-01 DIAGNOSIS — F17210 Nicotine dependence, cigarettes, uncomplicated: Secondary | ICD-10-CM | POA: Diagnosis not present

## 2018-09-01 LAB — BASIC METABOLIC PANEL
Anion gap: 10 (ref 5–15)
BUN: 9 mg/dL (ref 6–20)
CO2: 24 mmol/L (ref 22–32)
Calcium: 8.8 mg/dL — ABNORMAL LOW (ref 8.9–10.3)
Chloride: 103 mmol/L (ref 98–111)
Creatinine, Ser: 0.74 mg/dL (ref 0.61–1.24)
GFR calc Af Amer: >60 mL/min (ref >60)
GFR calc non Af Amer: >60 mL/min (ref >60)
Glucose, Bld: 89 mg/dL (ref 70–99)
Potassium: 4.5 mmol/L (ref 3.5–5.1)
Sodium: 137 mmol/L (ref 135–145)

## 2018-09-01 LAB — CBC WITH DIFFERENTIAL/PLATELET
Abs Immature Granulocytes: 0.01 10*3/uL (ref 0.00–0.07)
Basophils Absolute: 0.1 10*3/uL (ref 0.0–0.1)
Basophils Relative: 1 %
Eosinophils Absolute: 0.1 10*3/uL (ref 0.0–0.5)
Eosinophils Relative: 2 %
HCT: 43.5 % (ref 39.0–52.0)
Hemoglobin: 14.8 g/dL (ref 13.0–17.0)
Immature Granulocytes: 0 %
Lymphocytes Relative: 26 %
Lymphs Abs: 1.7 10*3/uL (ref 0.7–4.0)
MCH: 33.7 pg (ref 26.0–34.0)
MCHC: 34 g/dL (ref 30.0–36.0)
MCV: 99.1 fL (ref 80.0–100.0)
Monocytes Absolute: 0.7 10*3/uL (ref 0.1–1.0)
Monocytes Relative: 11 %
Neutro Abs: 3.8 10*3/uL (ref 1.7–7.7)
Neutrophils Relative %: 60 %
Platelets: 214 10*3/uL (ref 150–400)
RBC: 4.39 MIL/uL (ref 4.22–5.81)
RDW: 13 % (ref 11.5–15.5)
WBC: 6.4 10*3/uL (ref 4.0–10.5)
nRBC: 0 % (ref 0.0–0.2)

## 2018-09-01 LAB — TROPONIN I: Troponin I: <0.03 ng/mL (ref <0.03)

## 2018-09-01 MED ORDER — AMLODIPINE BESYLATE 5 MG PO TABS
5.0000 mg | ORAL_TABLET | Freq: Once | ORAL | Status: AC
  Administered 2018-09-01: 5 mg via ORAL
  Filled 2018-09-01: qty 1

## 2018-09-01 MED ORDER — AMLODIPINE BESYLATE 10 MG PO TABS: 10.0000 mg | ORAL_TABLET | Freq: Every day | ORAL | 0 refills | Status: DC

## 2018-09-01 NOTE — Discharge Instructions (Addendum)
As discussed, I am increasing your Norvasc dose from 5 mg to 10 mg once daily.  I provided a referral for a local clinic for you to establish primary care.  Apply ice packs on and off to your knee and wear the knee brace while at work.  Do not wear continuously or at bedtime.  Return to the ER for any worsening symptoms.

## 2018-09-01 NOTE — ED Provider Notes (Addendum)
[facility] EMERGENCY DEPARTMENT Provider Note   CSN: [id number] Arrival date & time: 09/01/18  1324     History   Chief Complaint Chief Complaint  Patient presents with  . Knee Pain    HPI Mike Keller is a 52 y.o. male.  HPI  Mike Keller is a 52 y.o. male with a past medical history of poorly controlled hypertension,  presents to the Emergency Department complaining of right knee pain for for several days.  He describes a sharp pain to the lateral aspect of the right knee that radiates down his leg.  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  Pain improves at rest.  He denies numbness of his lower leg, swelling, rash or other skin changes.  He is tried over-the-counter muscle rubs without relief. It was brought to my attention that patient went for x-ray of the knee prior to my initial exam, when he returned the x-ray technician noted that when the patient put his head back he began "shaking" all over and said that he felt dizzy.  The episode was noted to last 2 to 3 seconds.  No history of seizures or syncope.  Patient states that this is has been going on "for some months" and is recurring problem for him anytime he puts his head back or has sudden changes in position.  He denies headache, visual changes, numbness or weakness of the face or extremities, changes in speech, and syncopal episodes.  He does admit to a history of uncontrolled hypertension and has been advised in the past that he needs to see cardiology but he has not done so and does not currently have a PCP.   Past Medical History:  Diagnosis Date  . Allergy   . Asthma    as a child  . Chronic back pain   . Hypertension     Patient Active Problem List   Diagnosis Date Noted  . Tobacco abuse 04/20/2017  . Angioedema due to angiotensin converting enzyme inhibitor (ACE-I)   . Chronic back pain   .  Hypertension     Past Surgical History:  Procedure Laterality Date  . HAND SURGERY     left  . HAND SURGERY        Home Medications    Prior to Admission medications   Medication Sig Start Date End Date Taking? Authorizing Provider  amLODipine (NORVASC) 5 MG tablet Take 1 tablet (5 mg total) by mouth daily. 08/06/18   Loren Racer, MD  benzonatate (TESSALON) 100 MG capsule Take 1 capsule (100 mg total) by mouth 3 (three) times daily as needed for cough. 08/06/18   Loren Racer, MD    Family History Family History  Problem Relation Age of Onset  . Asthma Mother   . Hypertension Mother   . Hypertension Father   . Kidney disease Brother   . Stroke Brother   . Hypertension Brother     Social History Social History   Tobacco Use  . Smoking status: Current Every Day Smoker    Packs/day: 1.00    Types: Cigarettes    Start date: 09/06/1993  . Smokeless tobacco: Never Used  Substance Use Topics  . Alcohol use: Yes    Comment: beer, 2 cans daily  . Drug use: Yes    Types: Marijuana     Allergies  Lisinopril   Review of Systems Review of Systems  Constitutional: Negative for chills and fever.  Eyes: Negative for visual disturbance.  Respiratory: Negative for chest tightness and shortness of breath.   Cardiovascular: Negative for chest pain, palpitations and leg swelling.  Gastrointestinal: Negative for nausea and vomiting.  Genitourinary: Negative for decreased urine volume and difficulty urinating.  Musculoskeletal: Positive for arthralgias (Right knee pain). Negative for joint swelling.  Skin: Negative for color change and rash.  Neurological: Positive for dizziness. Negative for seizures, syncope, speech difficulty, weakness, numbness and headaches.     Physical Exam Updated Vital Signs BP (!) 163/101 (BP Location: Left Arm)   Pulse 93   Temp 98.5 F (36.9 C) (Oral)   Resp 16   Ht 5\' 7"  (1.702 m)   Wt 61 kg   SpO2 100%   BMI 21.06 kg/m    Physical Exam Vitals signs and nursing note reviewed.  Constitutional:      General: He is not in acute distress.    Appearance: Normal appearance. He is not ill-appearing or toxic-appearing.  HENT:     Head: Atraumatic.     Right Ear: Tympanic membrane and ear canal normal. No middle ear effusion.     Left Ear: Tympanic membrane and ear canal normal.  No middle ear effusion.     Mouth/Throat:     Pharynx: Oropharynx is clear. Uvula midline. No uvula swelling.  Eyes:     Extraocular Movements: Extraocular movements intact.     Pupils: Pupils are equal, round, and reactive to light.  Neck:     Musculoskeletal: Normal range of motion and neck supple.     Vascular: No carotid bruit.  Cardiovascular:     Rate and Rhythm: Normal rate and regular rhythm.     Pulses: Normal pulses.     Heart sounds: Normal heart sounds.  Pulmonary:     Effort: Pulmonary effort is normal.     Breath sounds: Normal breath sounds.  Musculoskeletal:        General: Tenderness present. No swelling.     Comments: Focal tenderness to palpation of the lateral aspect of the right knee.  No edema or palpable effusion.  Negative drawer sign.  No pain on valgus or varus stresses.  Lymphadenopathy:     Cervical: No cervical adenopathy.  Skin:    General: Skin is warm.     Capillary Refill: Capillary refill takes less than 2 seconds.     Findings: No erythema or rash.  Neurological:     General: No focal deficit present.     Mental Status: He is alert. Mental status is at baseline.     GCS: GCS eye subscore is 4. GCS verbal subscore is 5. GCS motor subscore is 6.     Cranial Nerves: No cranial nerve deficit.     Sensory: Sensation is intact. No sensory deficit.     Motor: Tremor present. No abnormal muscle tone or pronator drift.     Coordination: Coordination is intact. Coordination normal.     Gait: Gait is intact. Gait normal.     Comments: CN II-XII intact.  Speech clear.  Patient noted to have slight  tremors of the bilateral upper extremities with extension of the arms.  No pronator drift.  Grip strength is strong and symmetrical bilaterally.  Negative straight leg raise bilaterally.  Psychiatric:        Mood and Affect: Mood normal.      ED Treatments / Results  Labs (all labs ordered are listed, but only abnormal results are displayed) Labs Reviewed  BASIC METABOLIC PANEL - Abnormal; Notable for the following components:      Result Value   Calcium 8.8 (*)    All other components within normal limits  CBC WITH DIFFERENTIAL/PLATELET  TROPONIN I    EKG EKG Interpretation  Date/Time:  Friday September 01 2018 09:54:49 EST Ventricular Rate:  89 PR Interval:    QRS Duration: 80 QT Interval:  359 QTC Calculation: 437 R Axis:   79 Text Interpretation:  Sinus rhythm Artifact Confirmed by Vanetta MuldersZackowski, Scott [phone]) on 09/01/2018 10:00:34 AM   Radiology No results found.  Procedures Procedures (including critical care time)  Medications Ordered in ED Medications - No data to display   Initial Impression / Assessment and Plan / ED Course  I have reviewed the triage vital signs and the nursing notes.  Pertinent labs & imaging results that were available during my care of the patient were reviewed by me and considered in my medical decision making (see chart for details).        Patient is well-appearing.  He is not post ictal.  He is neurovascularly intact.  He has what appears to be an essential tremor on exam.  He has been advised to follow-up with cardiology and I have stressed the importance of this with him.  I will also provide referral information for neurology given that this tremulous activity is chronic and recurrent with movement of his head. Knee sleeve applied.  Patient continues to have poorly controlled hypertension and does not currently have a PCP.  Findings discussed with Dr. Deretha EmoryZackowski.   I will increase his Norvasc dose from 5 mg to 10 mg daily and he  agrees to establish primary care.  Referral information given.  He appears appropriate for discharge home, strict return precautions were discussed.  Final Clinical Impressions(s) / ED Diagnoses   Final diagnoses:  Acute pain of right knee  Tremor of both hands  Uncontrolled hypertension    ED Discharge Orders    None       Pauline Ausriplett, Deagan Sevin, PA-C 09/01/18 1217    Pauline Ausriplett, Elizabethann Lackey, PA-C 09/01/18 1217    Vanetta MuldersZackowski, Scott, MD 09/02/18 [phone]

## 2018-09-01 NOTE — ED Triage Notes (Signed)
Pt reports right lateral knee pain that radiates down right leg. Pain is described as sharp. 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

## 2018-09-01 NOTE — ED Notes (Signed)
Faith, x ray tech, reports she had pt lay down for his knee xray and she said he closed his eyes and started shaking having "seizure like activity."  Pt says his bp is really high.  Denies seizure history, pt says he remembers the episode.

## 2018-10-06 ENCOUNTER — Ambulatory Visit (INDEPENDENT_AMBULATORY_CARE_PROVIDER_SITE_OTHER): Payer: BLUE CROSS/BLUE SHIELD | Admitting: Cardiology

## 2018-10-06 ENCOUNTER — Encounter: Payer: Self-pay | Admitting: Cardiology

## 2018-10-06 VITALS — BP 136/85 | HR 74 | Ht 67.0 in | Wt 133.0 lb

## 2018-10-06 DIAGNOSIS — I10 Essential (primary) hypertension: Secondary | ICD-10-CM

## 2018-10-06 MED ORDER — AMLODIPINE BESYLATE 10 MG PO TABS: 10.0000 mg | ORAL_TABLET | Freq: Every day | ORAL | 3 refills | Status: DC

## 2018-10-06 NOTE — Progress Notes (Signed)
     Clinical Summary Mike Keller is a 53 y.o.male seen as new patient for HTN.   1. HTN - started on norvasc x 2 months - compliant with meds   2. Tremors - started about 07/2018 - only occur with laying down, when goes to sit up gets tremors in his neck and upper body. Gets headache.    Past Medical History:  Diagnosis Date  . Allergy   . Asthma    as a child  . Chronic back pain   . Hypertension      Allergies  Allergen Reactions  . Lisinopril Swelling    Angioedema      Current Outpatient Medications  Medication Sig Dispense Refill  . amLODipine (NORVASC) 10 MG tablet Take 1 tablet (10 mg total) by mouth daily. 30 tablet 0   No current facility-administered medications for this visit.      Past Surgical History:  Procedure Laterality Date  . HAND SURGERY     left  . HAND SURGERY       Allergies  Allergen Reactions  . Lisinopril Swelling    Angioedema       Family History  Problem Relation Age of Onset  . Asthma Mother   . Hypertension Mother   . Hypertension Father   . Kidney disease Brother   . Stroke Brother   . Hypertension Brother      Social History Mike Keller reports that he has been smoking cigarettes. He started smoking about 25 years ago. He has been smoking about 1.00 pack per day. He has never used smokeless tobacco. Mike Keller reports current alcohol use.   Review of Systems CONSTITUTIONAL: No weight loss, fever, chills, weakness or fatigue.  HEENT: Eyes: No visual loss, blurred vision, double vision or yellow sclerae.No hearing loss, sneezing, congestion, runny nose or sore throat.  SKIN: No rash or itching.  CARDIOVASCULAR: per hpi RESPIRATORY: No shortness of breath, cough or sputum.  GASTROINTESTINAL: No anorexia, nausea, vomiting or diarrhea. No abdominal pain or blood.  GENITOURINARY: No burning on urination, no polyuria NEUROLOGICAL: No headache, dizziness, syncope, paralysis, ataxia, numbness or tingling in the  extremities. No change in bowel or bladder control.  MUSCULOSKELETAL: No muscle, back pain, joint pain or stiffness.  LYMPHATICS: No enlarged nodes. No history of splenectomy.  PSYCHIATRIC: No history of depression or anxiety.  ENDOCRINOLOGIC: No reports of sweating, cold or heat intolerance. No polyuria or polydipsia.  Marland Kitchen   Physical Examination Vitals:   10/06/18 1439  BP: 136/85  Pulse: 74  SpO2: 99%   Vitals:   10/06/18 1439  Weight: 133 lb (60.3 kg)  Height: 5\' 7"  (1.702 m)    Gen: resting comfortably, no acute distress HEENT: no scleral icterus, pupils equal round and reactive, no palptable cervical adenopathy,  CV: RRR, no m/r/g, no jvd Resp: Clear to auscultation bilaterally GI: abdomen is soft, non-tender, non-distended, normal bowel sounds, no hepatosplenomegaly MSK: extremities are warm, no edema.  Skin: warm, no rash Neuro:  no focal deficits Psych: appropriate affect    Assessment and Plan  1. HTN - manual recheck 134/80. Since starting norvasc bp has been reasonaby controlled - discussed DASH diet, low sodium diet. If consistently above 130/80 with lifestyle modification may need to consider additional agent  2. Tremors - not cardiac in description, no further workup planned.    F/u as needed   Mike Keller, M.D.

## 2018-10-06 NOTE — Patient Instructions (Signed)
Medication Instructions:  Your physician recommends that you continue on your current medications as directed. Please refer to the Current Medication list given to you today.  If you need a refill on your cardiac medications before your next appointment, please call your pharmacy.   Lab work: NONE  If you have labs (blood work) drawn today and your tests are completely normal, you will receive your results only by: Marland Kitchen. MyChart Message (if you have MyChart) OR . A paper copy in the mail If you have any lab test that is abnormal or we need to change your treatment, we will call you to review the results.  Testing/Procedures: NONE   Follow-Up: At [facility], you and your health needs are our priority.  As part of our continuing mission to provide you with exceptional heart care, we have created designated Provider Care Teams.  These Care Teams include your primary Cardiologist (physician) and Advanced Practice Providers (APPs -  Physician Assistants and Nurse Practitioners) who all work together to provide you with the care you need, when you need it. You will need a follow up appointment as needed.  Please call our office 2 months in advance to schedule this appointment.  You may see Dina RichBranch, Jonathan, MD or one of the following Advanced Practice Providers on your designated Care Team:   Randall AnBrittany Strader, PA-C [facility]([place] Office) . Jacolyn ReedyMichele Lenze, PA-C [facility]([place] Office)  Any Other Special Instructions Will Be Listed Below (If Applicable). Thank you for choosing [facility]!   DASH Eating Plan DASH stands for "Dietary Approaches to Stop Hypertension." The DASH eating plan is a healthy eating plan that has been shown to reduce high blood pressure (hypertension). It may also reduce your risk for type 2 diabetes, heart disease, and stroke. The DASH eating plan may also help with weight loss. What are tips for following this plan?  General guidelines  Avoid eating more than 2,300  mg (milligrams) of salt (sodium) a day. If you have hypertension, you may need to reduce your sodium intake to 1,500 mg a day.  Limit alcohol intake to no more than 1 drink a day for nonpregnant women and 2 drinks a day for men. One drink equals 12 oz of beer, 5 oz of wine, or 1 oz of hard liquor.  Work with your health care provider to maintain a healthy body weight or to lose weight. Ask what an ideal weight is for you.  Get at least 30 minutes of exercise that causes your heart to beat faster (aerobic exercise) most days of the week. Activities may include walking, swimming, or biking.  Work with your health care provider or diet and nutrition specialist (dietitian) to adjust your eating plan to your individual calorie needs. Reading food labels   Check food labels for the amount of sodium per serving. Choose foods with less than 5 percent of the Daily Value of sodium. Generally, foods with less than 300 mg of sodium per serving fit into this eating plan.  To find whole grains, look for the word "whole" as the first word in the ingredient list. Shopping  Buy products labeled as "low-sodium" or "no salt added."  Buy fresh foods. Avoid canned foods and premade or frozen meals. Cooking  Avoid adding salt when cooking. Use salt-free seasonings or herbs instead of table salt or sea salt. Check with your health care provider or pharmacist before using salt substitutes.  Do not fry foods. Cook foods using healthy methods such as baking,  boiling, grilling, and broiling instead.  Cook with heart-healthy oils, such as olive, canola, soybean, or sunflower oil. Meal planning  Eat a balanced diet that includes: ? 5 or more servings of fruits and vegetables each day. At each meal, try to fill half of your plate with fruits and vegetables. ? Up to 6-8 servings of whole grains each day. ? Less than 6 oz of lean meat, poultry, or fish each day. A 3-oz serving of meat is about the same size as a  deck of cards. One egg equals 1 oz. ? 2 servings of low-fat dairy each day. ? A serving of nuts, seeds, or beans 5 times each week. ? Heart-healthy fats. Healthy fats called Omega-3 fatty acids are found in foods such as flaxseeds and coldwater fish, like sardines, salmon, and mackerel.  Limit how much you eat of the following: ? Canned or prepackaged foods. ? Food that is high in trans fat, such as fried foods. ? Food that is high in saturated fat, such as fatty meat. ? Sweets, desserts, sugary drinks, and other foods with added sugar. ? Full-fat dairy products.  Do not salt foods before eating.  Try to eat at least 2 vegetarian meals each week.  Eat more home-cooked food and less restaurant, buffet, and fast food.  When eating at a restaurant, ask that your food be prepared with less salt or no salt, if possible. What foods are recommended? The items listed may not be a complete list. Talk with your dietitian about what dietary choices are best for you. Grains Whole-grain or whole-wheat bread. Whole-grain or whole-wheat pasta. Brown rice. Orpah Cobb. Bulgur. Whole-grain and low-sodium cereals. Pita bread. Low-fat, low-sodium crackers. Whole-wheat flour tortillas. Vegetables Fresh or frozen vegetables (raw, steamed, roasted, or grilled). Low-sodium or reduced-sodium tomato and vegetable juice. Low-sodium or reduced-sodium tomato sauce and tomato paste. Low-sodium or reduced-sodium canned vegetables. Fruits All fresh, dried, or frozen fruit. Canned fruit in natural juice (without added sugar). Meat and other protein foods Skinless chicken or [place]. Ground chicken or [place]. Pork with fat trimmed off. Fish and seafood. Egg whites. Dried beans, peas, or lentils. Unsalted nuts, nut butters, and seeds. Unsalted canned beans. Lean cuts of beef with fat trimmed off. Low-sodium, lean deli meat. Dairy Low-fat (1%) or fat-free (skim) milk. Fat-free, low-fat, or reduced-fat cheeses. Nonfat,  low-sodium ricotta or cottage cheese. Low-fat or nonfat yogurt. Low-fat, low-sodium cheese. Fats and oils Soft margarine without trans fats. Vegetable oil. Low-fat, reduced-fat, or light mayonnaise and salad dressings (reduced-sodium). Canola, safflower, olive, soybean, and sunflower oils. Avocado. Seasoning and other foods Herbs. Spices. Seasoning mixes without salt. Unsalted popcorn and pretzels. Fat-free sweets. What foods are not recommended? The items listed may not be a complete list. Talk with your dietitian about what dietary choices are best for you. Grains Baked goods made with fat, such as croissants, muffins, or some breads. Dry pasta or rice meal packs. Vegetables Creamed or fried vegetables. Vegetables in a cheese sauce. Regular canned vegetables (not low-sodium or reduced-sodium). Regular canned tomato sauce and paste (not low-sodium or reduced-sodium). Regular tomato and vegetable juice (not low-sodium or reduced-sodium). Rosita Fire. Olives. Fruits Canned fruit in a light or heavy syrup. Fried fruit. Fruit in cream or butter sauce. Meat and other protein foods Fatty cuts of meat. Ribs. Fried meat. Tomasa Blase. Sausage. Bologna and other processed lunch meats. Salami. Fatback. Hotdogs. Bratwurst. Salted nuts and seeds. Canned beans with added salt. Canned or smoked fish. Whole eggs or egg yolks. Chicken  or [place] with skin. Dairy Whole or 2% milk, cream, and half-and-half. Whole or full-fat cream cheese. Whole-fat or sweetened yogurt. Full-fat cheese. Nondairy creamers. Whipped toppings. Processed cheese and cheese spreads. Fats and oils Butter. Stick margarine. Lard. Shortening. Ghee. Bacon fat. Tropical oils, such as coconut, palm kernel, or palm oil. Seasoning and other foods Salted popcorn and pretzels. Onion salt, garlic salt, seasoned salt, table salt, and sea salt. Worcestershire sauce. Tartar sauce. Barbecue sauce. Teriyaki sauce. Soy sauce, including reduced-sodium. Steak sauce.  Canned and packaged gravies. Fish sauce. Oyster sauce. Cocktail sauce. Horseradish that you find on the shelf. Ketchup. Mustard. Meat flavorings and tenderizers. Bouillon cubes. Hot sauce and Tabasco sauce. Premade or packaged marinades. Premade or packaged taco seasonings. Relishes. Regular salad dressings. Where to find more information:  National Heart, Lung, and Blood Institute: PopSteam.iswww.nhlbi.nih.gov  American Heart Association: www.heart.org Summary  The DASH eating plan is a healthy eating plan that has been shown to reduce high blood pressure (hypertension). It may also reduce your risk for type 2 diabetes, heart disease, and stroke.  With the DASH eating plan, you should limit salt (sodium) intake to 2,300 mg a day. If you have hypertension, you may need to reduce your sodium intake to 1,500 mg a day.  When on the DASH eating plan, aim to eat more fresh fruits and vegetables, whole grains, lean proteins, low-fat dairy, and heart-healthy fats.  Work with your health care provider or diet and nutrition specialist (dietitian) to adjust your eating plan to your individual calorie needs. This information is not intended to replace advice given to you by your health care provider. Make sure you discuss any questions you have with your health care provider. Document Released: 08/12/2011 Document Revised: 08/16/2016 Document Reviewed: 08/16/2016 Elsevier Interactive Patient Education  2019 [employer]

## 2018-10-08 ENCOUNTER — Encounter: Payer: Self-pay | Admitting: Cardiology

## 2019-03-20 ENCOUNTER — Other Ambulatory Visit: Payer: BLUE CROSS/BLUE SHIELD

## 2019-03-20 ENCOUNTER — Other Ambulatory Visit: Payer: Self-pay

## 2019-03-20 DIAGNOSIS — Z20822 Contact with and (suspected) exposure to covid-19: Secondary | ICD-10-CM

## 2019-03-23 LAB — NOVEL CORONAVIRUS, NAA: SARS-CoV-2, NAA: NOT DETECTED

## 2019-03-27 ENCOUNTER — Telehealth: Payer: Self-pay | Admitting: General Practice

## 2019-03-27 NOTE — Telephone Encounter (Signed)
Negative covid  results was giving to pt

## 2019-12-11 ENCOUNTER — Other Ambulatory Visit: Payer: Self-pay | Admitting: Cardiology

## 2020-01-10 ENCOUNTER — Other Ambulatory Visit: Payer: Self-pay | Admitting: Cardiology

## 2020-06-06 ENCOUNTER — Other Ambulatory Visit: Payer: Self-pay

## 2020-06-25 IMAGING — DX DG CHEST 2V
2 series · 2 of 2 positions shown · non-contrast
Comparison: 07/09/2013 chest radiograph.

CLINICAL DATA: Cough

EXAM:
CHEST - 2 VIEW

[chest pa]
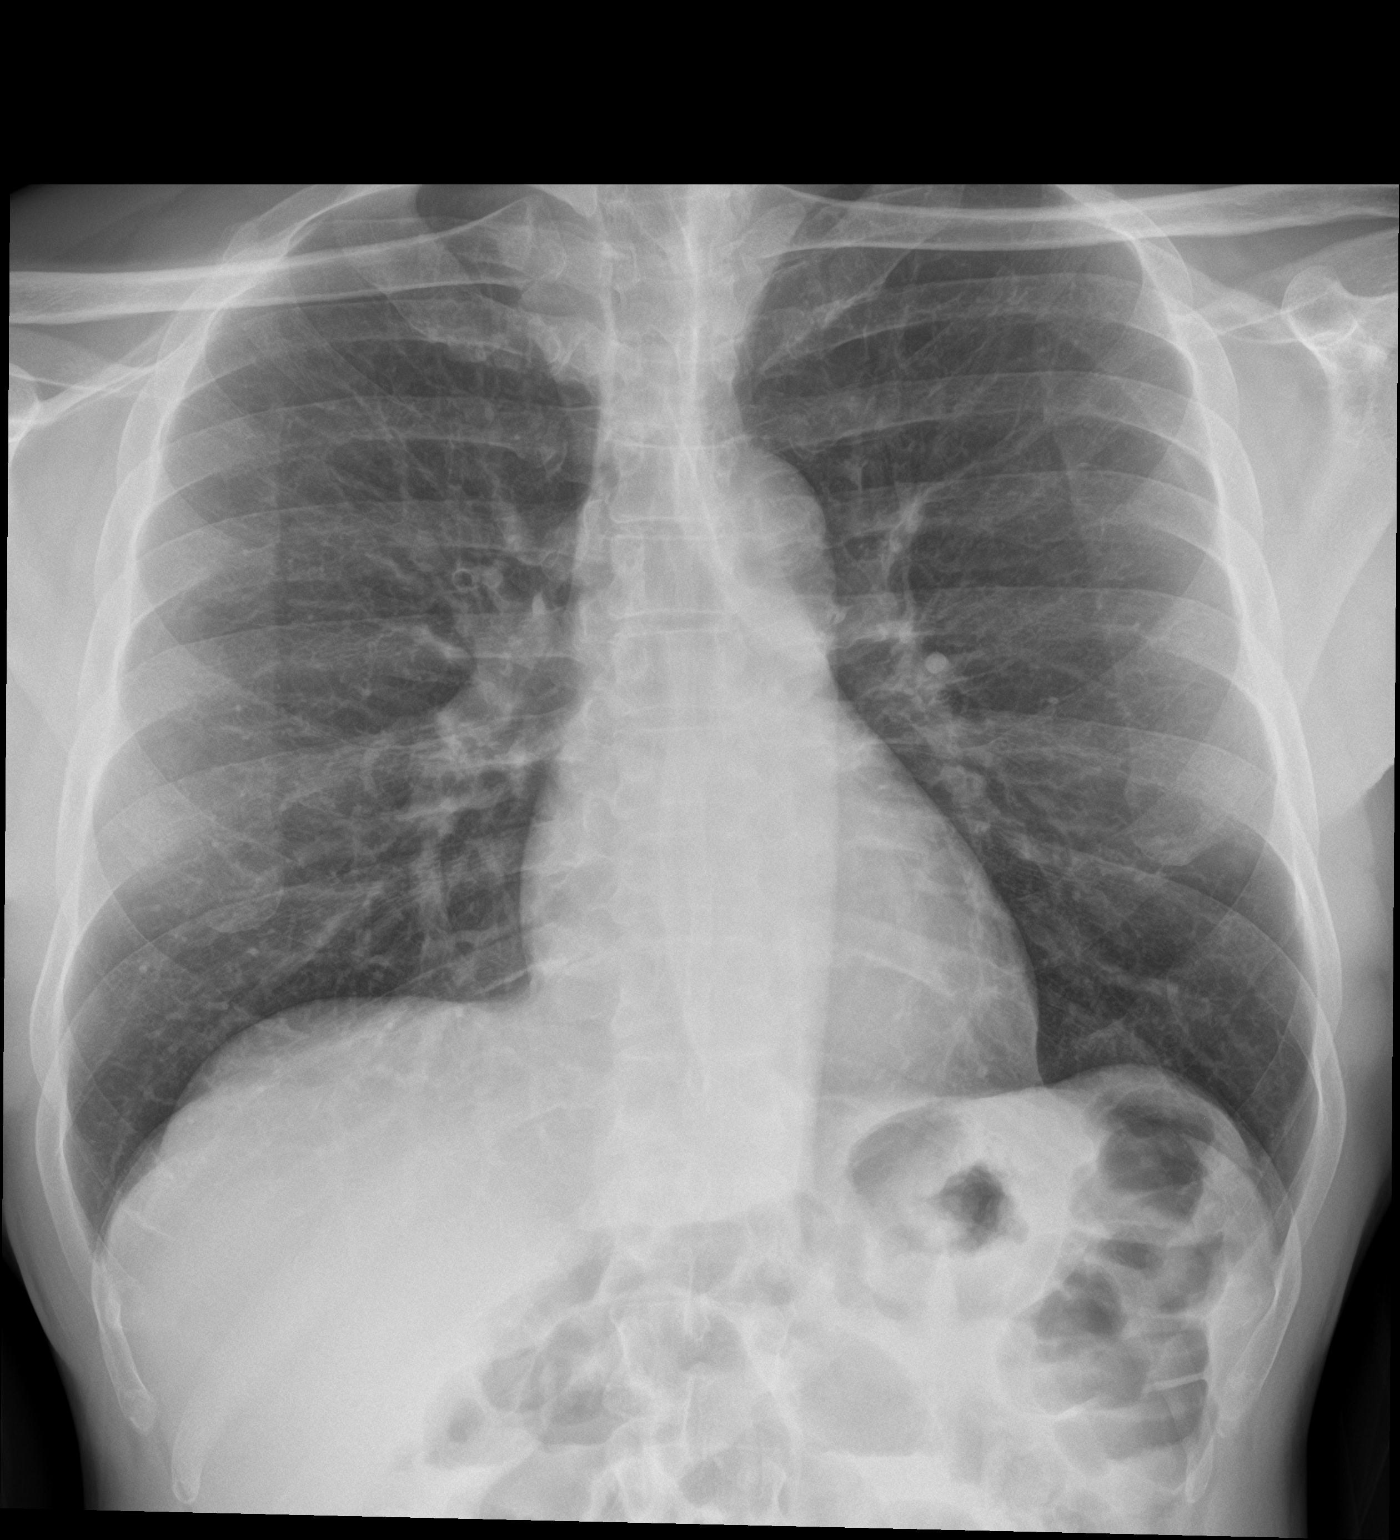

[chest lat]
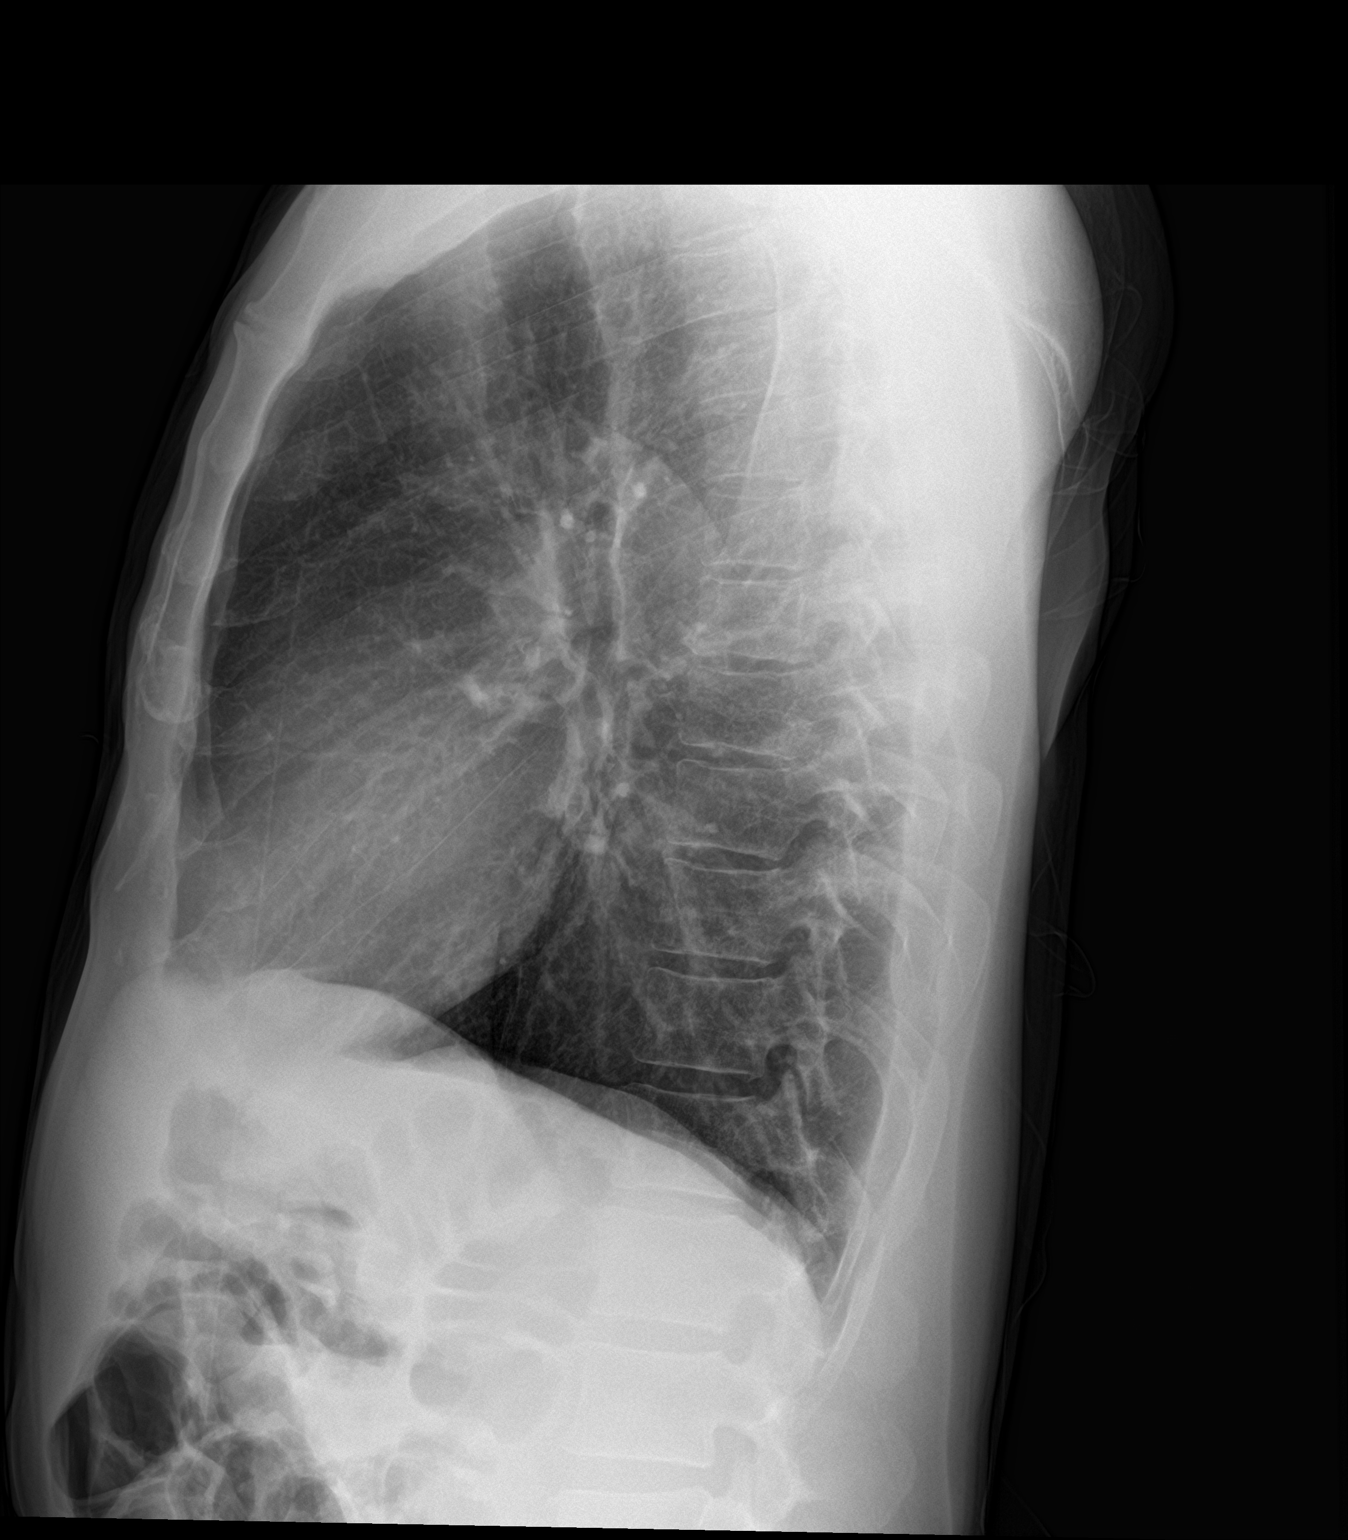

[2 of 2 positions shown; findings below may reference images not displayed]

FINDINGS: Stable cardiomediastinal silhouette with normal heart size. No
pneumothorax. No pleural effusion. Lungs appear clear, with no acute
consolidative airspace disease and no pulmonary edema.
IMPRESSION: No active cardiopulmonary disease.

## 2020-07-07 ENCOUNTER — Emergency Department (HOSPITAL_COMMUNITY)
Admission: EM | Admit: 2020-07-07 | Discharge: 2020-07-07 | Disposition: A | Discharge status: Home / Self Care | Payer: Self-pay | Attending: Emergency Medicine | Admitting: Emergency Medicine

## 2020-07-07 ENCOUNTER — Emergency Department (HOSPITAL_COMMUNITY): Payer: Self-pay

## 2020-07-07 ENCOUNTER — Encounter (HOSPITAL_COMMUNITY): Payer: Self-pay

## 2020-07-07 ENCOUNTER — Other Ambulatory Visit: Payer: Self-pay

## 2020-07-07 DIAGNOSIS — J45909 Unspecified asthma, uncomplicated: Secondary | ICD-10-CM | POA: Insufficient documentation

## 2020-07-07 DIAGNOSIS — I10 Essential (primary) hypertension: Secondary | ICD-10-CM | POA: Insufficient documentation

## 2020-07-07 DIAGNOSIS — F17210 Nicotine dependence, cigarettes, uncomplicated: Secondary | ICD-10-CM | POA: Insufficient documentation

## 2020-07-07 DIAGNOSIS — R911 Solitary pulmonary nodule: Secondary | ICD-10-CM | POA: Insufficient documentation

## 2020-07-07 DIAGNOSIS — R072 Precordial pain: Secondary | ICD-10-CM

## 2020-07-07 DIAGNOSIS — M546 Pain in thoracic spine: Secondary | ICD-10-CM | POA: Insufficient documentation

## 2020-07-07 LAB — BASIC METABOLIC PANEL
Anion gap: 10 (ref 5–15)
BUN: 10 mg/dL (ref 6–20)
CO2: 23 mmol/L (ref 22–32)
Calcium: 8.5 mg/dL — ABNORMAL LOW (ref 8.9–10.3)
Chloride: 102 mmol/L (ref 98–111)
Creatinine, Ser: 0.71 mg/dL (ref 0.61–1.24)
GFR, Estimated: >60 mL/min (ref >60)
Glucose, Bld: 143 mg/dL — ABNORMAL HIGH (ref 70–99)
Potassium: 3.7 mmol/L (ref 3.5–5.1)
Sodium: 135 mmol/L (ref 135–145)

## 2020-07-07 LAB — CBC
HCT: 42 % (ref 39.0–52.0)
Hemoglobin: 14.2 g/dL (ref 13.0–17.0)
MCH: 34.8 pg — ABNORMAL HIGH (ref 26.0–34.0)
MCHC: 33.8 g/dL (ref 30.0–36.0)
MCV: 102.9 fL — ABNORMAL HIGH (ref 80.0–100.0)
Platelets: 233 10*3/uL (ref 150–400)
RBC: 4.08 MIL/uL — ABNORMAL LOW (ref 4.22–5.81)
RDW: 12.4 % (ref 11.5–15.5)
WBC: 7.9 10*3/uL (ref 4.0–10.5)
nRBC: 0 % (ref 0.0–0.2)

## 2020-07-07 LAB — TROPONIN I (HIGH SENSITIVITY)
Troponin I (High Sensitivity): 5 ng/L (ref <18)
Troponin I (High Sensitivity): 5 ng/L (ref <18)

## 2020-07-07 MED ORDER — IBUPROFEN 800 MG PO TABS: 800.0000 mg | ORAL_TABLET | Freq: Three times a day (TID) | ORAL | 0 refills | Status: DC | PRN

## 2020-07-07 MED ORDER — DICLOFENAC SODIUM 1 % EX GEL: 2.0000 g | Freq: Four times a day (QID) | CUTANEOUS | 0 refills | Status: DC | PRN

## 2020-07-07 MED ORDER — IOHEXOL 350 MG/ML SOLN
100.0000 mL | Freq: Once | INTRAVENOUS | Status: AC | PRN
  Administered 2020-07-07: 75 mL via INTRAVENOUS

## 2020-07-07 NOTE — ED Provider Notes (Signed)
Emergency Department Provider Note   I have reviewed the triage vital signs and the nursing notes.   HISTORY  Chief Complaint Chest Pain   HPI Mike Keller is a 54 y.o. male presents to the emergency department for evaluation of mid back/chest discomfort over the past several days.  He describes the pain is sharp and intermittent.  It is mainly in his mid upper back and worse with deep breathing although does not feel significantly short of breath.  He does note intermittent pain over the past many years but nothing as severe as this.  Denies anterior chest heaviness or tightness.  No fevers or chills.  No hemoptysis.  No weakness or numbness.  He does not use IV drugs. No fever.   Past Medical History:  Diagnosis Date  . Allergy   . Asthma    as a child  . Chronic back pain   . Hypertension     Patient Active Problem List   Diagnosis Date Noted  . Tobacco abuse 04/20/2017  . Angioedema due to angiotensin converting enzyme inhibitor (ACE-I)   . Chronic back pain   . Hypertension     Past Surgical History:  Procedure Laterality Date  . HAND SURGERY     left  . HAND SURGERY      Allergies Lisinopril  Family History  Problem Relation Age of Onset  . Asthma Mother   . Hypertension Mother   . Hypertension Father   . Kidney disease Brother   . Stroke Brother   . Hypertension Brother     Social History Social History   Tobacco Use  . Smoking status: Current Every Day Smoker    Packs/day: 1.50    Types: Cigarettes    Start date: 09/06/1993  . Smokeless tobacco: Never Used  Vaping Use  . Vaping Use: Never used  Substance Use Topics  . Alcohol use: Yes    Comment: beer, 2 cans daily  . Drug use: Not Currently    Types: Marijuana    Review of Systems  Constitutional: No fever/chills Eyes: No visual changes. ENT: No sore throat. Cardiovascular: Pleuritic chest pain. Respiratory: Denies shortness of breath. Gastrointestinal: No abdominal pain.  No  nausea, no vomiting.  No diarrhea.  No constipation. Genitourinary: Negative for dysuria. Musculoskeletal: Positive high thoracic back pain.  Skin: Negative for rash. Neurological: Negative for headaches, focal weakness or numbness.  10-point ROS otherwise negative.  ____________________________________________   PHYSICAL EXAM:  VITAL SIGNS: ED Triage Vitals  Enc Vitals Group     BP 07/07/20 0711 (!) 148/83     Pulse Rate 07/07/20 0711 95     Resp 07/07/20 0711 18     Temp 07/07/20 0711 98.3 F (36.8 C)     Temp Source 07/07/20 0711 Oral     SpO2 07/07/20 0711 98 %     Weight 07/07/20 0709 136 lb (61.7 kg)     Height 07/07/20 0709 5\' 11"  (1.803 m)   Constitutional: Alert and oriented. Well appearing and in no acute distress. Eyes: Conjunctivae are normal. Head: Atraumatic. Nose: No congestion/rhinnorhea. Mouth/Throat: Mucous membranes are moist.   Neck: No stridor.   Cardiovascular: Normal rate, regular rhythm. Good peripheral circulation. Grossly normal heart sounds.   Respiratory: Normal respiratory effort.  No retractions. Lungs CTAB. Gastrointestinal: Soft and nontender. No distention.  Musculoskeletal: Patient with point tenderness in the upper thoracic spine T3/4 region. No stepoffs. No tenderness to the remaining spine.  Neurologic:  Normal speech  and language. No gross focal neurologic deficits are appreciated.  Skin:  Skin is warm, dry and intact. No rash noted. ____________________________________________   LABS (all labs ordered are listed, but only abnormal results are displayed)  Labs Reviewed  BASIC METABOLIC PANEL - Abnormal; Notable for the following components:      Result Value   Glucose, Bld 143 (*)    Calcium 8.5 (*)    All other components within normal limits  CBC - Abnormal; Notable for the following components:   RBC 4.08 (*)    MCV 102.9 (*)    MCH 34.8 (*)    All other components within normal limits  TROPONIN I (HIGH SENSITIVITY)    TROPONIN I (HIGH SENSITIVITY)   ____________________________________________  EKG   EKG Interpretation  Date/Time:  Monday July 07 2020 07:00:18 EDT Ventricular Rate:  101 PR Interval:  166 QRS Duration: 72 QT Interval:  332 QTC Calculation: 430 R Axis:   75 Text Interpretation: Sinus tachycardia Otherwise normal ECG No STEMI Confirmed by Alona Bene [phone]) on 07/07/2020 7:32:43 AM       ____________________________________________  RADIOLOGY  CXR and CTA chest reviewed.  ____________________________________________   PROCEDURES  Procedure(s) performed:   Procedures  None  ____________________________________________   INITIAL IMPRESSION / ASSESSMENT AND PLAN / ED COURSE  Pertinent labs & imaging results that were available during my care of the patient were reviewed by me and considered in my medical decision making (see chart for details).   Patient presents emergency department for evaluation of chest pain which on exam seems more midline thoracic back pain which radiates to the chest and is somewhat pleuritic.  Initial imaging is reassuring.  Troponins are normal.  EKG with mild sinus tachycardia.  Plan for CTA to rule out PE and further visualize the thoracic spine.  He has not high risk for discitis/osteomyelitis or epidural abscess although these were considered.   Initial blood work including troponin, chemistries, CBC reviewed.  Chest x-ray is clear with no acute findings.  Patient's pain is somewhat pleuritic and my suspicion for PE is high.  Given patient's age and increased clinical suspicion for PE have opted for CTA of the chest.  This was performed with no PE or other acute process.  No thoracic spine process.  Plan for supportive care meds at home and close PCP follow up. Discussed ED return precautions.  ____________________________________________  FINAL CLINICAL IMPRESSION(S) / ED DIAGNOSES  Final diagnoses:  Precordial chest pain  Midline  thoracic back pain, unspecified chronicity  Pulmonary nodule     MEDICATIONS GIVEN DURING THIS VISIT:  Medications  iohexol (OMNIPAQUE) 350 MG/ML injection 100 mL (75 mLs Intravenous Contrast Given 07/07/20 0955)     NEW OUTPATIENT MEDICATIONS STARTED DURING THIS VISIT:  Discharge Medication List as of 07/07/2020 10:40 AM    START taking these medications   Details  diclofenac Sodium (VOLTAREN) 1 % GEL Apply 2 g topically 4 (four) times daily as needed., Starting Mon 07/07/2020, Normal    ibuprofen (ADVIL) 800 MG tablet Take 1 tablet (800 mg total) by mouth every 8 (eight) hours as needed., Starting Mon 07/07/2020, Normal        Note:  This document was prepared using Dragon voice recognition software and may include unintentional dictation errors.  Alona Bene, MD, [facility] Emergency Medicine    Taevyn Hausen, Arlyss Repress, MD 07/08/20 1515

## 2020-07-07 NOTE — ED Triage Notes (Signed)
Pt reports chest pain since Friday.  Reports pain is sharp and intermittent. Pt says also has pain in center of upper back.  Denies injury.  Pt says has had same pain in back x 12 years off and on.  Reports pain is worse to touch or take a deep breath.

## 2020-07-07 NOTE — Discharge Instructions (Addendum)
You were seen in the emergency room today with discomfort in your back and some chest pain.  Your EKG and CT scans here are normal.  I am calling in 2 prescriptions to help with your discomfort and have listed the name of a primary care doctor.  Please follow closely with this clinic by phone today to schedule a follow-up appointment.  Return to the emergency department for new or suddenly worsening symptoms.  Of note, your CT scan did show a small pulmonary nodule.  Please discuss this with the primary care doctor listed.  They may decide to repeat a CT scan in the next year to make sure this nodule is not getting any larger.

## 2020-09-04 ENCOUNTER — Other Ambulatory Visit: Payer: Self-pay

## 2021-03-16 ENCOUNTER — Other Ambulatory Visit: Payer: Self-pay | Admitting: Physician Assistant

## 2021-03-16 ENCOUNTER — Ambulatory Visit: Payer: Self-pay | Admitting: Physician Assistant

## 2021-03-16 ENCOUNTER — Encounter: Payer: Self-pay | Admitting: Physician Assistant

## 2021-03-16 VITALS — BP 120/75 | HR 10 | Temp 97.5°F | Ht 67.0 in | Wt 126.0 lb

## 2021-03-16 DIAGNOSIS — Z1211 Encounter for screening for malignant neoplasm of colon: Secondary | ICD-10-CM

## 2021-03-16 DIAGNOSIS — Z131 Encounter for screening for diabetes mellitus: Secondary | ICD-10-CM

## 2021-03-16 DIAGNOSIS — Z7689 Persons encountering health services in other specified circumstances: Secondary | ICD-10-CM

## 2021-03-16 DIAGNOSIS — R911 Solitary pulmonary nodule: Secondary | ICD-10-CM

## 2021-03-16 DIAGNOSIS — Z13220 Encounter for screening for lipoid disorders: Secondary | ICD-10-CM

## 2021-03-16 DIAGNOSIS — I10 Essential (primary) hypertension: Secondary | ICD-10-CM

## 2021-03-16 DIAGNOSIS — F17200 Nicotine dependence, unspecified, uncomplicated: Secondary | ICD-10-CM

## 2021-03-16 DIAGNOSIS — Z125 Encounter for screening for malignant neoplasm of prostate: Secondary | ICD-10-CM

## 2021-03-16 MED ORDER — AMLODIPINE BESYLATE 5 MG PO TABS: 5.0000 mg | ORAL_TABLET | Freq: Every day | ORAL | 1 refills | Status: DC

## 2021-03-16 NOTE — Progress Notes (Signed)
BP 120/75   Pulse (!) 10   Temp (!) 97.5 F (36.4 C)   Ht 5\' 7"  (1.702 m)   Wt 126 lb (57.2 kg)   SpO2 98%   BMI 19.73 kg/m    Subjective:    Patient ID: , male    DOB: 1966/07/06, 55 y.o.   MRN: [id number]  HPI: Mike Keller is a 55 y.o. male presenting on 03/16/2021 for No chief complaint on file.   HPI   Pt had a negative covid 19 screening questionnaire.  Pt is 54yoM who presents to establish care.  He says he has had No medical care in 2 year but he says he takes a bp pill every day.  Pt doesn't know name of his medicine.  Called [facility] which states norvasc 10 mg last picked up in February.  Pt now admits he skips and may just take pill qod.    He got covid vaccination but doesn't have his card  He says he feels good and has no complaints.      Relevant past medical, surgical, family and social history reviewed and updated as indicated. Interim medical history since our last visit reviewed. Allergies and medications reviewed and updated.  CURRENT MEDS: Unknown (call to [facility] states norvasc 10mg  last filled in February)  Review of Systems  Per HPI unless specifically indicated above     Objective:    BP 120/75   Pulse (!) 10   Temp (!) 97.5 F (36.4 C)   Ht 5\' 7"  (1.702 m)   Wt 126 lb (57.2 kg)   SpO2 98%   BMI 19.73 kg/m   Wt Readings from Last 3 Encounters:  03/16/21 126 lb (57.2 kg)  07/07/20 136 lb (61.7 kg)  10/06/18 133 lb (60.3 kg)    Physical Exam Vitals reviewed.  Constitutional:      General: He is not in acute distress.    Appearance: He is well-developed. He is not ill-appearing.  HENT:     Head: Normocephalic and atraumatic.     Right Ear: Tympanic membrane, ear canal and external ear normal.     Left Ear: Tympanic membrane, ear canal and external ear normal.  Eyes:     Extraocular Movements: Extraocular movements intact.     Conjunctiva/sclera: Conjunctivae normal.     Pupils: Pupils are equal, round, and  reactive to light.  Neck:     Thyroid: No thyromegaly.  Cardiovascular:     Rate and Rhythm: Normal rate and regular rhythm.  Pulmonary:     Effort: Pulmonary effort is normal.     Breath sounds: Normal breath sounds. No wheezing or rales.  Abdominal:     General: Bowel sounds are normal.     Palpations: Abdomen is soft. There is no mass.     Tenderness: There is no abdominal tenderness.  Musculoskeletal:     Cervical back: Neck supple.     Right lower leg: No edema.     Left lower leg: No edema.  Lymphadenopathy:     Cervical: No cervical adenopathy.  Skin:    General: Skin is warm and dry.     Findings: No rash.  Neurological:     Mental Status: He is alert and oriented to person, place, and time.     Motor: No weakness or tremor.     Gait: Gait is intact. Gait normal.     Deep Tendon Reflexes:     Reflex Scores:  Patellar reflexes are 2+ on the right side and 2+ on the left side. Psychiatric:        Attention and Perception: Attention normal.        Mood and Affect: Affect is not inappropriate.        Speech: Speech normal.        Behavior: Behavior normal. Behavior is cooperative.     Comments: Pleasant.  Engages easily.            Assessment & Plan:    Encounter Diagnoses  Name Primary?   Encounter to establish care Yes   Primary hypertension    Tobacco use disorder    Pulmonary nodule    Screening for prostate cancer    Screening for diabetes mellitus    Screening for colon cancer    Screening cholesterol level      -will get baseline Labs -pt is given FIT test for colon cancer screening -rx Norvasc 5 mg. Discussed with pt that bp good on random dosing so will try every day norvasc 5mg .  He agrees -Review of Epic records- pulmonary odule- due for repeat CT November 2022.  Pt says he is aware. -Pt to bring covid card to next appointment for documentation -encouraged smoking cessation -pt to follow up 1 month.  He is to contact office sooner  prn

## 2021-03-17 ENCOUNTER — Telehealth: Payer: Self-pay

## 2021-03-17 NOTE — Telephone Encounter (Signed)
Called client for follow up after [facility] appointment. Client reports everything went well he is aware to get his fasting labs tomorrow am at [facility]. He was ordered lower dose of norvasc at 5 mg and is at [facility]. Client does not have the money to get those.  Client will come by [facility] card to get 9$ medication.   Francee Nodal RN [employer]

## 2021-03-18 ENCOUNTER — Other Ambulatory Visit (HOSPITAL_COMMUNITY)
Admission: RE | Admit: 2021-03-18 | Discharge: 2021-03-18 | Disposition: A | Discharge status: Home / Self Care | Payer: Self-pay | Source: Ambulatory Visit | Attending: Physician Assistant | Admitting: Physician Assistant

## 2021-03-18 DIAGNOSIS — Z131 Encounter for screening for diabetes mellitus: Secondary | ICD-10-CM | POA: Insufficient documentation

## 2021-03-18 DIAGNOSIS — I10 Essential (primary) hypertension: Secondary | ICD-10-CM | POA: Insufficient documentation

## 2021-03-18 DIAGNOSIS — Z13220 Encounter for screening for lipoid disorders: Secondary | ICD-10-CM | POA: Insufficient documentation

## 2021-03-18 DIAGNOSIS — Z125 Encounter for screening for malignant neoplasm of prostate: Secondary | ICD-10-CM | POA: Insufficient documentation

## 2021-03-18 LAB — COMPREHENSIVE METABOLIC PANEL
ALT: 19 U/L (ref 0–44)
AST: 41 U/L (ref 15–41)
Albumin: 4 g/dL (ref 3.5–5.0)
Alkaline Phosphatase: 60 U/L (ref 38–126)
Anion gap: 8 (ref 5–15)
BUN: 5 mg/dL — ABNORMAL LOW (ref 6–20)
CO2: 24 mmol/L (ref 22–32)
Calcium: 8.9 mg/dL (ref 8.9–10.3)
Chloride: 100 mmol/L (ref 98–111)
Creatinine, Ser: 0.71 mg/dL (ref 0.61–1.24)
GFR, Estimated: >60 mL/min (ref >60)
Glucose, Bld: 107 mg/dL — ABNORMAL HIGH (ref 70–99)
Potassium: 4 mmol/L (ref 3.5–5.1)
Sodium: 132 mmol/L — ABNORMAL LOW (ref 135–145)
Total Bilirubin: 1.4 mg/dL — ABNORMAL HIGH (ref 0.3–1.2)
Total Protein: 7.4 g/dL (ref 6.5–8.1)

## 2021-03-18 LAB — HEMOGLOBIN A1C
Hgb A1c MFr Bld: 5.4 % (ref 4.8–5.6)
Mean Plasma Glucose: 108.28 mg/dL

## 2021-03-18 LAB — IFOBT (OCCULT BLOOD): IFOBT: NEGATIVE

## 2021-03-18 LAB — LIPID PANEL
Cholesterol: 98 mg/dL (ref 0–200)
HDL: 67 mg/dL (ref >40)
LDL Cholesterol: 27 mg/dL (ref 0–99)
Total CHOL/HDL Ratio: 1.5 RATIO
Triglycerides: 19 mg/dL (ref <150)
VLDL: 4 mg/dL (ref 0–40)

## 2021-03-18 LAB — PSA: Prostatic Specific Antigen: 2.97 ng/mL (ref 0.00–4.00)

## 2021-04-16 ENCOUNTER — Ambulatory Visit: Payer: Self-pay | Admitting: Physician Assistant

## 2021-04-23 ENCOUNTER — Ambulatory Visit: Payer: Self-pay | Admitting: Physician Assistant

## 2021-09-03 ENCOUNTER — Ambulatory Visit: Payer: Self-pay | Admitting: Physician Assistant

## 2021-09-03 ENCOUNTER — Other Ambulatory Visit: Payer: Self-pay

## 2021-09-03 ENCOUNTER — Encounter: Payer: Self-pay | Admitting: Physician Assistant

## 2021-09-03 VITALS — BP 159/88 | HR 106 | Temp 98.0°F

## 2021-09-03 DIAGNOSIS — F17200 Nicotine dependence, unspecified, uncomplicated: Secondary | ICD-10-CM

## 2021-09-03 DIAGNOSIS — I10 Essential (primary) hypertension: Secondary | ICD-10-CM

## 2021-09-03 DIAGNOSIS — R911 Solitary pulmonary nodule: Secondary | ICD-10-CM

## 2021-09-03 DIAGNOSIS — Z23 Encounter for immunization: Secondary | ICD-10-CM

## 2021-09-03 MED ORDER — AMLODIPINE BESYLATE 5 MG PO TABS: 5.0000 mg | ORAL_TABLET | Freq: Every day | ORAL | 1 refills | Status: DC

## 2021-09-03 NOTE — Patient Instructions (Signed)
Influenza (Flu) Vaccine (Inactivated or Recombinant): What You Need to Know 1. Why get vaccinated? Influenza vaccine can prevent influenza (flu). Flu is a contagious disease that spreads around the [place] every year, usually between October and May. Anyone can get the flu, but it is more dangerous for some people. Infants and young children, people 65 years and older, pregnant people, and people with certain health conditions or a weakened immune system are at greatest risk of flu complications. Pneumonia, bronchitis, sinus infections, and ear infections are examples of flu-related complications. If you have a medical condition, such as heart disease, cancer, or diabetes, flu can make it worse. Flu can cause fever and chills, sore throat, muscle aches, fatigue, cough, headache, and runny or stuffy nose. Some people may have vomiting and diarrhea, though this is more common in children than adults. In an average year, thousands of people in the [place] die from flu, and many more are hospitalized. Flu vaccine prevents millions of illnesses and flu-related visits to the doctor each year. 2. Influenza vaccines CDC recommends everyone 6 months and older get vaccinated every flu season. Children 6 months through 8 years of age may need 2 doses during a single flu season. Everyone else needs only 1 dose each flu season. It takes about 2 weeks for protection to develop after vaccination. There are many flu viruses, and they are always changing. Each year a new flu vaccine is made to protect against the influenza viruses believed to be likely to cause disease in the upcoming flu season. Even when the vaccine doesn't exactly match these viruses, it may still provide some protection. Influenza vaccine does not cause flu. Influenza vaccine may be given at the same time as other vaccines. 3. Talk with your health care provider Tell your vaccination provider if the person getting the vaccine: Has had  an allergic reaction after a previous dose of influenza vaccine, or has any severe, life-threatening allergies Has ever had Guillain-Barr Syndrome (also called "GBS") In some cases, your health care provider may decide to postpone influenza vaccination until a future visit. Influenza vaccine can be administered at any time during pregnancy. People who are or will be pregnant during influenza season should receive inactivated influenza vaccine. People with minor illnesses, such as a cold, may be vaccinated. People who are moderately or severely ill should usually wait until they recover before getting influenza vaccine. Your health care provider can give you more information. 4. Risks of a vaccine reaction Soreness, redness, and swelling where the shot is given, fever, muscle aches, and headache can happen after influenza vaccination. There may be a very small increased risk of Guillain-Barr Syndrome (GBS) after inactivated influenza vaccine (the flu shot). Young children who get the flu shot along with pneumococcal vaccine (PCV13) and/or DTaP vaccine at the same time might be slightly more likely to have a seizure caused by fever. Tell your health care provider if a child who is getting flu vaccine has ever had a seizure. People sometimes faint after medical procedures, including vaccination. Tell your provider if you feel dizzy or have vision changes or ringing in the ears. As with any medicine, there is a very remote chance of a vaccine causing a severe allergic reaction, other serious injury, or death. 5. What if there is a serious problem? An allergic reaction could occur after the vaccinated person leaves the clinic. If you see signs of a severe allergic reaction (hives, swelling of the face and throat, difficulty breathing,   a fast heartbeat, dizziness, or weakness), call 9-1-1 and get the person to the nearest hospital. For other signs that concern you, call your health care provider. Adverse  reactions should be reported to the Vaccine Adverse Event Reporting System (VAERS). Your health care provider will usually file this report, or you can do it yourself. Visit the VAERS website at www.vaers.hhs.gov or call 1-800-822-7967. VAERS is only for reporting reactions, and VAERS staff members do not give medical advice. 6. The National Vaccine Injury Compensation Program The National Vaccine Injury Compensation Program (VICP) is a federal program that was created to compensate people who may have been injured by certain vaccines. Claims regarding alleged injury or death due to vaccination have a time limit for filing, which may be as short as two years. Visit the VICP website at www.hrsa.gov/vaccinecompensation or call 1-800-338-2382 to learn about the program and about filing a claim. 7. How can I learn more? Ask your health care provider. Call your local or state health department. Visit the website of the Food and Drug Administration (FDA) for vaccine package inserts and additional information at www.fda.gov/vaccines-blood-biologics/vaccines. Contact the Centers for Disease Control and Prevention (CDC): Call 1-800-232-4636 (1-800-CDC-INFO) or Visit CDC's website at www.cdc.gov/flu. Vaccine Information Statement Inactivated Influenza Vaccine (04/11/2020) This information is not intended to replace advice given to you by your health care provider. Make sure you discuss any questions you have with your health care provider. Document Revised: 05/14/2021 Document Reviewed: 05/14/2021 Elsevier Patient Education  2022 Elsevier Inc.  

## 2021-09-03 NOTE — Progress Notes (Signed)
BP (!) 159/88    Pulse (!) 106    Temp 98 F (36.7 C)    SpO2 97%    Subjective:    Patient ID: Mike Keller, male    DOB: June 11, 1966, 55 y.o.   MRN: [id number]  HPI: Mike Keller is a 55 y.o. male presenting on 09/03/2021 for Hypertension   HPI   Chief Complaint  Patient presents with   Hypertension      He doesn't take his bp med every day.  He works at [employer].  He hopes to get insurance maybe in July.      He says he is doing well and has no complaints.  He denies CP, sob.  He says he got the covid immunization but doesn't have his card.    Relevant past medical, surgical, family and social history reviewed and updated as indicated. Interim medical history since our last visit reviewed. Allergies and medications reviewed and updated.   Current Outpatient Medications:    amLODipine (NORVASC) 5 MG tablet, Take 1 tablet (5 mg total) by mouth daily., Disp: 30 tablet, Rfl: 1    Review of Systems  Per HPI unless specifically indicated above     Objective:    BP (!) 159/88    Pulse (!) 106    Temp 98 F (36.7 C)    SpO2 97%   Wt Readings from Last 3 Encounters:  03/16/21 126 lb (57.2 kg)  07/07/20 136 lb (61.7 kg)  10/06/18 133 lb (60.3 kg)    Physical Exam Vitals reviewed.  Constitutional:      General: He is not in acute distress.    Appearance: He is well-developed. He is not ill-appearing.  HENT:     Head: Normocephalic and atraumatic.  Cardiovascular:     Rate and Rhythm: Normal rate and regular rhythm.  Pulmonary:     Effort: Pulmonary effort is normal.     Breath sounds: Normal breath sounds. No wheezing.  Abdominal:     General: Bowel sounds are normal.     Palpations: Abdomen is soft.     Tenderness: There is no abdominal tenderness.  Musculoskeletal:     Cervical back: Neck supple.     Right lower leg: No edema.     Left lower leg: No edema.  Lymphadenopathy:     Cervical: No cervical adenopathy.  Skin:    General: Skin is warm and  dry.  Neurological:     Mental Status: He is alert and oriented to person, place, and time.  Psychiatric:        Behavior: Behavior normal.    Results for orders placed or performed during the hospital encounter of 03/18/21  PSA  Result Value Ref Range   Prostatic Specific Antigen 2.97 0.00 - 4.00 ng/mL  Hemoglobin A1c  Result Value Ref Range   Hgb A1c MFr Bld 5.4 4.8 - 5.6 %   Mean Plasma Glucose 108.28 mg/dL  Lipid panel  Result Value Ref Range   Cholesterol 98 0 - 200 mg/dL   Triglycerides 19 <347 mg/dL   HDL 67 >42 mg/dL   Total CHOL/HDL Ratio 1.5 RATIO   VLDL 4 0 - 40 mg/dL   LDL Cholesterol 27 0 - 99 mg/dL  Comprehensive metabolic panel  Result Value Ref Range   Sodium 132 (L) 135 - 145 mmol/L   Potassium 4.0 3.5 - 5.1 mmol/L   Chloride 100 98 - 111 mmol/L   CO2 24 22 - 32  mmol/L   Glucose, Bld 107 (H) 70 - 99 mg/dL   BUN 5 (L) 6 - 20 mg/dL   Creatinine, Ser 7.32 0.61 - 1.24 mg/dL   Calcium 8.9 8.9 - 20.2 mg/dL   Total Protein 7.4 6.5 - 8.1 g/dL   Albumin 4.0 3.5 - 5.0 g/dL   AST 41 15 - 41 U/L   ALT 19 0 - 44 U/L   Alkaline Phosphatase 60 38 - 126 U/L   Total Bilirubin 1.4 (H) 0.3 - 1.2 mg/dL   GFR, Estimated >54 >27 mL/min   Anion gap 8 5 - 15      Assessment & Plan:   Encounter Diagnoses  Name Primary?   Primary hypertension Yes   Tobacco use disorder    Influenza vaccination administered at current visit    Pulmonary nodule      -reviewed labs with p t -Recommended covid booster -Flu shot given today -Recommended smoking cessation -pt was counseled to Take amlodipine every day for his htn -will discuss repeat CT at his next appointment -pt to follow up 1 month to recheck bp.  He is to contact office sooner prn

## 2021-09-23 ENCOUNTER — Other Ambulatory Visit: Payer: Self-pay | Admitting: Physician Assistant

## 2021-09-23 DIAGNOSIS — I10 Essential (primary) hypertension: Secondary | ICD-10-CM

## 2021-10-05 ENCOUNTER — Ambulatory Visit: Payer: Self-pay | Admitting: Physician Assistant

## 2021-11-23 ENCOUNTER — Encounter (HOSPITAL_COMMUNITY): Payer: Self-pay | Admitting: *Deleted

## 2021-11-23 ENCOUNTER — Other Ambulatory Visit: Payer: Self-pay

## 2021-11-23 ENCOUNTER — Emergency Department (HOSPITAL_COMMUNITY)
Admission: EM | Admit: 2021-11-23 | Discharge: 2021-11-23 | Disposition: A | Discharge status: Home / Self Care | Payer: Self-pay | Attending: Emergency Medicine | Admitting: Emergency Medicine

## 2021-11-23 ENCOUNTER — Emergency Department (HOSPITAL_COMMUNITY): Payer: Self-pay

## 2021-11-23 DIAGNOSIS — S20211A Contusion of right front wall of thorax, initial encounter: Secondary | ICD-10-CM | POA: Insufficient documentation

## 2021-11-23 DIAGNOSIS — J069 Acute upper respiratory infection, unspecified: Secondary | ICD-10-CM | POA: Insufficient documentation

## 2021-11-23 DIAGNOSIS — W19XXXA Unspecified fall, initial encounter: Secondary | ICD-10-CM | POA: Insufficient documentation

## 2021-11-23 DIAGNOSIS — Z79899 Other long term (current) drug therapy: Secondary | ICD-10-CM | POA: Insufficient documentation

## 2021-11-23 DIAGNOSIS — Z720 Tobacco use: Secondary | ICD-10-CM | POA: Insufficient documentation

## 2021-11-23 DIAGNOSIS — T783XXA Angioneurotic edema, initial encounter: Secondary | ICD-10-CM | POA: Insufficient documentation

## 2021-11-23 DIAGNOSIS — I10 Essential (primary) hypertension: Secondary | ICD-10-CM | POA: Insufficient documentation

## 2021-11-23 DIAGNOSIS — Z20822 Contact with and (suspected) exposure to covid-19: Secondary | ICD-10-CM | POA: Insufficient documentation

## 2021-11-23 DIAGNOSIS — J45909 Unspecified asthma, uncomplicated: Secondary | ICD-10-CM | POA: Insufficient documentation

## 2021-11-23 LAB — RESP PANEL BY RT-PCR (FLU A&B, COVID) ARPGX2
Influenza A by PCR: NEGATIVE
Influenza B by PCR: NEGATIVE
SARS Coronavirus 2 by RT PCR: NEGATIVE

## 2021-11-23 MED ORDER — HYDROCODONE-ACETAMINOPHEN 5-325 MG PO TABS: 1.0000 | ORAL_TABLET | ORAL | 0 refills | Status: DC | PRN

## 2021-11-23 MED ORDER — AEROCHAMBER PLUS FLO-VU MEDIUM MISC
1.0000 | Freq: Once | Status: DC
  Filled 2021-11-23: qty 1

## 2021-11-23 MED ORDER — DIPHENHYDRAMINE HCL 25 MG PO CAPS
25.0000 mg | ORAL_CAPSULE | Freq: Once | ORAL | Status: AC
  Administered 2021-11-23: 25 mg via ORAL
  Filled 2021-11-23: qty 1

## 2021-11-23 MED ORDER — PREDNISONE 10 MG (21) PO TBPK: ORAL_TABLET | Freq: Every day | ORAL | 0 refills | Status: DC

## 2021-11-23 MED ORDER — METOPROLOL SUCCINATE ER 25 MG PO TB24: 25.0000 mg | ORAL_TABLET | Freq: Every day | ORAL | 0 refills | Status: DC

## 2021-11-23 MED ORDER — PREDNISONE 50 MG PO TABS
60.0000 mg | ORAL_TABLET | Freq: Once | ORAL | Status: AC
  Administered 2021-11-23: 60 mg via ORAL
  Filled 2021-11-23: qty 1

## 2021-11-23 MED ORDER — ALBUTEROL SULFATE HFA 108 (90 BASE) MCG/ACT IN AERS
2.0000 | INHALATION_SPRAY | Freq: Once | RESPIRATORY_TRACT | Status: AC
  Administered 2021-11-23: 2 via RESPIRATORY_TRACT
  Filled 2021-11-23: qty 6.7

## 2021-11-23 MED ORDER — IBUPROFEN 600 MG PO TABS: 600.0000 mg | ORAL_TABLET | Freq: Four times a day (QID) | ORAL | 0 refills | Status: DC | PRN

## 2021-11-23 NOTE — ED Notes (Signed)
Patient transported to X-ray 

## 2021-11-23 NOTE — ED Triage Notes (Signed)
Swelling of lip x 3 days ?

## 2021-11-23 NOTE — ED Provider Notes (Signed)
? EMERGENCY DEPARTMENT ?Provider Note ? ? ?CSN: [id number] ?Arrival date & time: 11/23/21  1403 ? ?  ? ?History ? ?Chief Complaint  ?Patient presents with  ? Allergic Reaction  ? ? ?Mike Keller is a 56 y.o. male. ? 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  He fell about a week ago and has pain to his right ribs.  He has had a cough for several days.  He has had productive sputum.  He also has some swelling in his upper lip.  He takes amlodipine for htn.  He denies any tongue swelling or difficulty swallowing. ? ? ?  ? ?Home Medications ?Prior to Admission medications   ?Medication Sig Start Date End Date Taking? Authorizing Provider  ?HYDROcodone-acetaminophen (NORCO/VICODIN) 5-325 MG tablet Take 1 tablet by mouth every 4 (four) hours as needed. 11/23/21  Yes Jacalyn Lefevre, MD  ?ibuprofen (ADVIL) 600 MG tablet Take 1 tablet (600 mg total) by mouth every 6 (six) hours as needed. 11/23/21  Yes Jacalyn Lefevre, MD  ?metoprolol succinate (TOPROL-XL) 25 MG 24 hr tablet Take 1 tablet (25 mg total) by mouth daily. 11/23/21  Yes Jacalyn Lefevre, MD  ?predniSONE (STERAPRED UNI-PAK 21 TAB) 10 MG (21) TBPK tablet Take by mouth daily. Take 6 tabs by mouth daily  for 2 days, then 5 tabs for 2 days, then 4 tabs for 2 days, then 3 tabs for 2 days, 2 tabs for 2 days, then 1 tab by mouth daily for 2 days 11/23/21  Yes Jacalyn Lefevre, MD  ?amLODipine (NORVASC) 5 MG tablet Take 1 tablet (5 mg total) by mouth daily. 09/03/21   Jacquelin Hawking, PA-C  ?   ? ?Allergies    ?Lisinopril   ? ?Review of Systems   ?Review of Systems  ?HENT:  Positive for facial swelling.   ?Respiratory:  Positive for cough.   ?Cardiovascular:  Positive for chest pain.  ?All other systems reviewed and are negative. ? ?Physical Exam ?Updated Vital Signs ?BP (!) 152/90   Pulse 78   Temp 98.2 ?F (36.8 ?C) (Oral)   Resp 18   Ht 5\' 7"  (1.702 m)   Wt 62.1 kg   SpO2 99%   BMI 21.46 kg/m?  ?Physical  Exam ?Vitals and nursing note reviewed.  ?Constitutional:   ?   Appearance: Normal appearance.  ?HENT:  ?   Head: Normocephalic and atraumatic.  ?   Comments: Upper lip swelling ?   Right Ear: External ear normal.  ?   Left Ear: External ear normal.  ?   Nose: Nose normal.  ?Eyes:  ?   Extraocular Movements: Extraocular movements intact.  ?   Conjunctiva/sclera: Conjunctivae normal.  ?   Pupils: Pupils are equal, round, and reactive to light.  ?Cardiovascular:  ?   Rate and Rhythm: Normal rate and regular rhythm.  ?   Pulses: Normal pulses.  ?   Heart sounds: Normal heart sounds.  ?Pulmonary:  ?   Effort: Pulmonary effort is normal.  ?   Breath sounds: Normal breath sounds.  ?Chest:  ? ? ?Abdominal:  ?   General: Abdomen is flat. Bowel sounds are normal.  ?   Palpations: Abdomen is soft.  ?Musculoskeletal:     ?   General: Normal range of motion.  ?   Cervical back: Normal range of motion and neck supple.  ?Skin: ?   General: Skin is warm.  ?  Capillary Refill: Capillary refill takes less than 2 seconds.  ?Neurological:  ?   General: No focal deficit present.  ?   Mental Status: He is alert and oriented to person, place, and time.  ?Psychiatric:     ?   Mood and Affect: Mood normal.     ?   Behavior: Behavior normal.  ? ? ?ED Results / Procedures / Treatments   ?Labs ?(all labs ordered are listed, but only abnormal results are displayed) ?Labs Reviewed  ?RESP PANEL BY RT-PCR (FLU A&B, COVID) [id number]  ? ? ?EKG ?None ? ?Radiology ?DG Ribs Unilateral W/Chest Right ? ?Result Date: 11/23/2021 ?CLINICAL DATA:  Pain EXAM: RIGHT RIBS AND CHEST - 3 VIEW COMPARISON:  None. FINDINGS: No fracture or other bone lesions are seen involving the ribs. There is no evidence of pneumothorax or pleural effusion. Both lungs are clear. Heart size and mediastinal contours are within normal limits. IMPRESSION: No evidence of displaced rib fracture.  Lungs are clear. Electronically Signed   By: Yetta Glassman M.D.   On: 11/23/2021 15:43    ? ?Procedures ?Procedures  ? ? ?Medications Ordered in ED ?Medications  ?predniSONE (DELTASONE) tablet 60 mg (60 mg Oral Given 11/23/21 1625)  ?diphenhydrAMINE (BENADRYL) capsule 25 mg (25 mg Oral Given 11/23/21 1625)  ?albuterol (VENTOLIN HFA) 108 (90 Base) MCG/ACT inhaler 2 puff (2 puffs Inhalation Given 11/23/21 1626)  ? ? ?ED Course/ Medical Decision Making/ A&P ?  ?                        ?Medical Decision Making ?Amount and/or Complexity of Data Reviewed ?Radiology: ordered. ? ?Risk ?Prescription drug management. ? ? ?This patient presents to the ED for concern of chest wall pain, cough, and lip swelling, this involves an extensive number of treatment options, and is a complaint that carries with it a high risk of complications and morbidity.  The differential diagnosis includes rib fx, rib contusion, uri, pna, bronchitis, and angioedema ? ? ?Co morbidities that complicate the patient evaluation ? ?Hx angioedema, htn ? ? ?Additional history obtained: ? ?Additional history obtained from epic chart review ?External records from outside source obtained and reviewed including family member ? ? ?Lab Tests: ? ?I Ordered, and personally interpreted labs.  The pertinent results include:  covid/flu neg ? ? ?Imaging Studies ordered: ? ?I ordered imaging studies including cxr  ?I independently visualized and interpreted imaging which showed  ?  ?IMPRESSION:  ?No evidence of displaced rib fracture.  Lungs are clear.  ? ?I agree with the radiologist interpretation ? ? ? ? ?Medicines ordered and prescription drug management: ? ?I ordered medication including benadryl, prednisone, and albuterol  for angioedema  ?Reevaluation of the patient after these medicines showed that the patient improved ?I have reviewed the patients home medicines and have made adjustments as needed ? ? ?Critical Interventions: ? ?prednisone ? ? ? ?Problem List / ED Course: ? ?Angioedema:  Pt is on amlodipine which very rarely causes angioedema.  He had  it pretty severely with lisinopril and required an ICU admission.  Due to the possibility of amlodipine causing it, I will change his bp med to metoprolol.  Pt is feeling much better and the swelling is better.  He has no tongue swelling or sob, so I think he is safe for home.  Pt d/c with prednisone.  He knows to return if worse. Pt is to establish care with pcp. ?Cough:  he is  negative for covid/flu.  Cxr is clear.  It is likely viral.  He is encouraged to try to stop smoking. ?Right rib pain:  xr neg for fx,  pt d/w with lortab.   ? ? ?Reevaluation: ? ?After the interventions noted above, I reevaluated the patient and found that they have :improved ? ? ?Social Determinants of Health: ? ?Lives at home.  No pcp. ? ? ?Dispostion: ? ?After consideration of the diagnostic results and the patients response to treatment, I feel that the patent would benefit from discharge with outpatient f/u.   ? ? ? ? ? ? ? ?Final Clinical Impression(s) / ED Diagnoses ?Final diagnoses:  ?Angioedema, initial encounter  ?Viral upper respiratory tract infection  ?Contusion of rib on right side, initial encounter  ?Tobacco abuse  ? ? ?Rx / DC Orders ?ED Discharge Orders   ? ?      Ordered  ?  predniSONE (STERAPRED UNI-PAK 21 TAB) 10 MG (21) TBPK tablet  Daily       ? 11/23/21 1722  ?  HYDROcodone-acetaminophen (NORCO/VICODIN) 5-325 MG tablet  Every 4 hours PRN       ? 11/23/21 1722  ?  ibuprofen (ADVIL) 600 MG tablet  Every 6 hours PRN       ? 11/23/21 1722  ?  metoprolol succinate (TOPROL-XL) 25 MG 24 hr tablet  Daily       ? 11/23/21 1723  ? ?  ?  ? ?  ? ? ?  ?Isla Pence, MD ?11/24/21 1553 ? ?

## 2021-11-23 NOTE — Discharge Instructions (Addendum)
Try to stop smoking.   ? ?Stop amlodipine. ?

## 2021-11-23 NOTE — ED Notes (Signed)
Airway patent. No shortness of breath noted. No signs of swelling or distress. VSS.  ?

## 2021-12-01 ENCOUNTER — Telehealth: Payer: Self-pay

## 2021-12-01 NOTE — Telephone Encounter (Signed)
Called to follow up with [facility] client with recent visit to [facility] ER on 11/23/21 for swelling of his lip and rib pain from a recent fall. ?Client reports he is feeling better. He was changed to Metoprolol for his blood pressure and he reports he is doing well with that medication and he was able to get it. He was also given and steroid dose pack and he says it has helped. His rib pain is less, but he reports he still has a "knot" He reports, "they told me nothing was broken". ?Client provider has been [facility] [employer], however he had two missed appointments and was sent a letter with a 30 day suspension that should need around 11/05/21. ?Encouraged client to call [facility] and schedule an appointment for follow up and management of his blood pressure and other issues. He was thankful for the call. ?He does report he is able to be up and moving around and feels better.  ?He reports he had similar issue with Lisinopril but it was worse than with the amlodipine. ?Reminded client that [facility] is here for any needs or questions.  ?Reminded client of our food market to help out if needed and our hours.  ? ?Will plan follow up next week to see if client has made a follow up appointment and how he is feeling. Client agreeable.  ?Reminded client to call [facility] anytime he may not be able to make his appointment at least a day ahead and if no one answers to please leave a voicemail telling the office he needs to cancel his appointment. Will continue to attempt to reinforce this and address any potential barrier to missing appointments. ? ?Francee Nodal RN  ?[employer] ?

## 2021-12-05 ENCOUNTER — Other Ambulatory Visit: Payer: Self-pay

## 2021-12-05 ENCOUNTER — Encounter (HOSPITAL_COMMUNITY): Payer: Self-pay | Admitting: Emergency Medicine

## 2021-12-05 ENCOUNTER — Emergency Department (HOSPITAL_COMMUNITY)
Admission: EM | Admit: 2021-12-05 | Discharge: 2021-12-05 | Disposition: A | Discharge status: Home / Self Care | Payer: Self-pay | Attending: Emergency Medicine | Admitting: Emergency Medicine

## 2021-12-05 ENCOUNTER — Emergency Department (HOSPITAL_COMMUNITY): Payer: Self-pay

## 2021-12-05 DIAGNOSIS — Z79899 Other long term (current) drug therapy: Secondary | ICD-10-CM | POA: Insufficient documentation

## 2021-12-05 DIAGNOSIS — D649 Anemia, unspecified: Secondary | ICD-10-CM | POA: Insufficient documentation

## 2021-12-05 DIAGNOSIS — R7989 Other specified abnormal findings of blood chemistry: Secondary | ICD-10-CM | POA: Insufficient documentation

## 2021-12-05 DIAGNOSIS — R040 Epistaxis: Secondary | ICD-10-CM | POA: Insufficient documentation

## 2021-12-05 DIAGNOSIS — D72829 Elevated white blood cell count, unspecified: Secondary | ICD-10-CM | POA: Insufficient documentation

## 2021-12-05 DIAGNOSIS — F17210 Nicotine dependence, cigarettes, uncomplicated: Secondary | ICD-10-CM | POA: Insufficient documentation

## 2021-12-05 LAB — BASIC METABOLIC PANEL
Anion gap: 11 (ref 5–15)
BUN: 25 mg/dL — ABNORMAL HIGH (ref 6–20)
CO2: 21 mmol/L — ABNORMAL LOW (ref 22–32)
Calcium: 8.4 mg/dL — ABNORMAL LOW (ref 8.9–10.3)
Chloride: 104 mmol/L (ref 98–111)
Creatinine, Ser: 0.83 mg/dL (ref 0.61–1.24)
GFR, Estimated: >60 mL/min (ref >60)
Glucose, Bld: 142 mg/dL — ABNORMAL HIGH (ref 70–99)
Potassium: 3.9 mmol/L (ref 3.5–5.1)
Sodium: 136 mmol/L (ref 135–145)

## 2021-12-05 LAB — HEPATIC FUNCTION PANEL
ALT: 27 U/L (ref 0–44)
AST: 38 U/L (ref 15–41)
Albumin: 3.5 g/dL (ref 3.5–5.0)
Alkaline Phosphatase: 50 U/L (ref 38–126)
Bilirubin, Direct: 0.2 mg/dL (ref 0.0–0.2)
Indirect Bilirubin: 1.3 mg/dL — ABNORMAL HIGH (ref 0.3–0.9)
Total Bilirubin: 1.5 mg/dL — ABNORMAL HIGH (ref 0.3–1.2)
Total Protein: 6.4 g/dL — ABNORMAL LOW (ref 6.5–8.1)

## 2021-12-05 LAB — CBC WITH DIFFERENTIAL/PLATELET
Abs Immature Granulocytes: 0.06 10*3/uL (ref 0.00–0.07)
Basophils Absolute: 0.1 10*3/uL (ref 0.0–0.1)
Basophils Relative: 0 %
Eosinophils Absolute: 0.1 10*3/uL (ref 0.0–0.5)
Eosinophils Relative: 0 %
HCT: 34.1 % — ABNORMAL LOW (ref 39.0–52.0)
Hemoglobin: 10.9 g/dL — ABNORMAL LOW (ref 13.0–17.0)
Immature Granulocytes: 1 %
Lymphocytes Relative: 42 %
Lymphs Abs: 5.3 10*3/uL — ABNORMAL HIGH (ref 0.7–4.0)
MCH: 33.6 pg (ref 26.0–34.0)
MCHC: 32 g/dL (ref 30.0–36.0)
MCV: 105.2 fL — ABNORMAL HIGH (ref 80.0–100.0)
Monocytes Absolute: 1.3 10*3/uL — ABNORMAL HIGH (ref 0.1–1.0)
Monocytes Relative: 10 %
Neutro Abs: 6 10*3/uL (ref 1.7–7.7)
Neutrophils Relative %: 47 %
Platelets: 244 10*3/uL (ref 150–400)
RBC: 3.24 MIL/uL — ABNORMAL LOW (ref 4.22–5.81)
RDW: 12.7 % (ref 11.5–15.5)
WBC: 12.7 10*3/uL — ABNORMAL HIGH (ref 4.0–10.5)
nRBC: 0 % (ref 0.0–0.2)

## 2021-12-05 LAB — TYPE AND SCREEN
ABO/RH(D): A POS
Antibody Screen: NEGATIVE

## 2021-12-05 LAB — PROTIME-INR
INR: 1 (ref 0.8–1.2)
Prothrombin Time: 13.5 seconds (ref 11.4–15.2)

## 2021-12-05 LAB — CBG MONITORING, ED: Glucose-Capillary: 124 mg/dL — ABNORMAL HIGH (ref 70–99)

## 2021-12-05 LAB — CBC
HCT: 29.4 % — ABNORMAL LOW (ref 39.0–52.0)
HCT: 27.3 % — ABNORMAL LOW (ref 39.0–52.0)
Hemoglobin: 9.6 g/dL — ABNORMAL LOW (ref 13.0–17.0)
Hemoglobin: 9 g/dL — ABNORMAL LOW (ref 13.0–17.0)
MCH: 34.4 pg — ABNORMAL HIGH (ref 26.0–34.0)
MCH: 34.6 pg — ABNORMAL HIGH (ref 26.0–34.0)
MCHC: 32.7 g/dL (ref 30.0–36.0)
MCHC: 33 g/dL (ref 30.0–36.0)
MCV: 105.4 fL — ABNORMAL HIGH (ref 80.0–100.0)
MCV: 105 fL — ABNORMAL HIGH (ref 80.0–100.0)
Platelets: 203 10*3/uL (ref 150–400)
Platelets: 181 10*3/uL (ref 150–400)
RBC: 2.79 MIL/uL — ABNORMAL LOW (ref 4.22–5.81)
RBC: 2.6 MIL/uL — ABNORMAL LOW (ref 4.22–5.81)
RDW: 12.7 % (ref 11.5–15.5)
RDW: 12.8 % (ref 11.5–15.5)
WBC: 12.3 10*3/uL — ABNORMAL HIGH (ref 4.0–10.5)
WBC: 12.6 10*3/uL — ABNORMAL HIGH (ref 4.0–10.5)
nRBC: 0 % (ref 0.0–0.2)
nRBC: 0 % (ref 0.0–0.2)

## 2021-12-05 MED ORDER — AMOXICILLIN-POT CLAVULANATE 875-125 MG PO TABS
1.0000 | ORAL_TABLET | Freq: Once | ORAL | Status: AC
  Administered 2021-12-05: 1 via ORAL
  Filled 2021-12-05: qty 1

## 2021-12-05 MED ORDER — SODIUM CHLORIDE 0.9 % IV BOLUS
1000.0000 mL | Freq: Once | INTRAVENOUS | Status: AC
  Administered 2021-12-05: 1000 mL via INTRAVENOUS

## 2021-12-05 MED ORDER — AMOXICILLIN-POT CLAVULANATE 875-125 MG PO TABS: 1.0000 | ORAL_TABLET | Freq: Two times a day (BID) | ORAL | 0 refills | Status: DC

## 2021-12-05 MED ORDER — TRANEXAMIC ACID FOR EPISTAXIS
500.0000 mg | Freq: Once | TOPICAL | Status: AC
  Administered 2021-12-05: 500 mg via TOPICAL
  Filled 2021-12-05: qty 10

## 2021-12-05 MED ORDER — HYDROMORPHONE HCL 1 MG/ML IJ SOLN
1.0000 mg | Freq: Once | INTRAMUSCULAR | Status: AC
  Administered 2021-12-05: 1 mg via INTRAVENOUS
  Filled 2021-12-05: qty 1

## 2021-12-05 NOTE — ED Notes (Signed)
Pt ambulated to bathroom with no assistance.  

## 2021-12-05 NOTE — ED Notes (Signed)
Went over d/c papers with patient and family all questions answered . Ambulatory to lobby. Denies pain.  ?

## 2021-12-05 NOTE — ED Provider Notes (Signed)
?[facility] EMERGENCY DEPARTMENT ?Provider Note ? ? ?CSN: [id number] ?Arrival date & time: 12/05/21  1023 ? ?  ? ?History ? ?Chief Complaint  ?Patient presents with  ? Epistaxis  ? ? ?Decklan A Flenner is a 56 y.o. male. ? ? ?Epistaxis ? ?Patient is a 56 year old male presenting today due to epistaxis.  Started acutely at 8 AM this morning, has been constant.  He tried holding pressure, it seems to be coming mostly out of the left nare but he feels it draining on both sides.  Was brought by EMS, given Afrin in route to EMS.  Has not helped.  He is not on any anticoagulation, takes blood pressure medicine only. ? ?Patient is a daily cigarette user.  He drinks almost daily, no alcohol today.  No history of gastric ulcers, gastritis, liver disease.  No clotting disorder in the family. ? ?Home Medications ?Prior to Admission medications   ?Medication Sig Start Date End Date Taking? Authorizing Provider  ?amLODipine (NORVASC) 5 MG tablet Take 1 tablet (5 mg total) by mouth daily. 09/03/21  Yes Jacquelin Hawking, PA-C  ?ibuprofen (ADVIL) 600 MG tablet Take 1 tablet (600 mg total) by mouth every 6 (six) hours as needed. 11/23/21  Yes Jacalyn Lefevre, MD  ?metoprolol succinate (TOPROL-XL) 25 MG 24 hr tablet Take 1 tablet (25 mg total) by mouth daily. 11/23/21  Yes Jacalyn Lefevre, MD  ?predniSONE (STERAPRED UNI-PAK 21 TAB) 10 MG (21) TBPK tablet Take by mouth daily. Take 6 tabs by mouth daily  for 2 days, then 5 tabs for 2 days, then 4 tabs for 2 days, then 3 tabs for 2 days, 2 tabs for 2 days, then 1 tab by mouth daily for 2 days 11/23/21  Yes Jacalyn Lefevre, MD  ?HYDROcodone-acetaminophen (NORCO/VICODIN) 5-325 MG tablet Take 1 tablet by mouth every 4 (four) hours as needed. ?Patient not taking: Reported on 12/05/2021 11/23/21   Jacalyn Lefevre, MD  ?   ? ?Allergies    ?Lisinopril   ? ?Review of Systems   ?Review of Systems  ?HENT:  Positive for nosebleeds.   ? ?Physical Exam ?Updated Vital Signs ?BP (!) 119/99   Pulse 100   Temp  97.9 ?F (36.6 ?C) (Oral)   Resp 15   Ht 5\' 7"  (1.702 m)   Wt 61.7 kg   SpO2 100%   BMI 21.30 kg/m?  ?Physical Exam ?Vitals and nursing note reviewed. Exam conducted with a chaperone present.  ?Constitutional:   ?   General: He is not in acute distress. ?   Appearance: Normal appearance.  ?HENT:  ?   Head: Normocephalic and atraumatic.  ?   Nose:  ?   Comments: Left epistasis. Unable to visualize source of bleeding.  ?Eyes:  ?   General: No scleral icterus. ?   Extraocular Movements: Extraocular movements intact.  ?   Pupils: Pupils are equal, round, and reactive to light.  ?Cardiovascular:  ?   Rate and Rhythm: Normal rate and regular rhythm.  ?Pulmonary:  ?   Effort: Pulmonary effort is normal.  ?   Breath sounds: Normal breath sounds.  ?Skin: ?   Coloration: Skin is not jaundiced.  ?Neurological:  ?   Mental Status: He is alert. Mental status is at baseline.  ?   Coordination: Coordination normal.  ? ? ?ED Results / Procedures / Treatments   ?Labs ?(all labs ordered are listed, but only abnormal results are displayed) ?Labs Reviewed  ?BASIC METABOLIC PANEL - Abnormal; Notable for  the following components:  ?    Result Value  ? CO2 21 (*)   ? Glucose, Bld 142 (*)   ? BUN 25 (*)   ? Calcium 8.4 (*)   ? All other components within normal limits  ?CBC WITH DIFFERENTIAL/PLATELET - Abnormal; Notable for the following components:  ? WBC 12.7 (*)   ? RBC 3.24 (*)   ? Hemoglobin 10.9 (*)   ? HCT 34.1 (*)   ? MCV 105.2 (*)   ? Lymphs Abs 5.3 (*)   ? Monocytes Absolute 1.3 (*)   ? All other components within normal limits  ?HEPATIC FUNCTION PANEL - Abnormal; Notable for the following components:  ? Total Protein 6.4 (*)   ? Total Bilirubin 1.5 (*)   ? Indirect Bilirubin 1.3 (*)   ? All other components within normal limits  ?CBC - Abnormal; Notable for the following components:  ? WBC 12.3 (*)   ? RBC 2.79 (*)   ? Hemoglobin 9.6 (*)   ? HCT 29.4 (*)   ? MCV 105.4 (*)   ? MCH 34.4 (*)   ? All other components within  normal limits  ?CBG MONITORING, ED - Abnormal; Notable for the following components:  ? Glucose-Capillary 124 (*)   ? All other components within normal limits  ?PROTIME-INR  ?TYPE AND SCREEN  ? ? ?EKG ?EKG Interpretation ? ?Date/Time:  Saturday December 05 2021 12:26:03 EDT ?Ventricular Rate:  123 ?PR Interval:  131 ?QRS Duration: 72 ?QT Interval:  304 ?QTC Calculation: 435 ?R Axis:   78 ?Text Interpretation: Sinus tachycardia Confirmed by Alvester Chou [phone]) on 12/05/2021 1:18:03 PM ? ?Radiology ?No results found. ? ?Procedures ?Procedures  ? ? ?Medications Ordered in ED ?Medications  ?tranexamic acid (CYKLOKAPRON) 1000 MG/10ML topical solution 500 mg (500 mg Topical Given 12/05/21 1242)  ?HYDROmorphone (DILAUDID) injection 1 mg (1 mg Intravenous Given 12/05/21 1243)  ?sodium chloride 0.9 % bolus 1,000 mL (0 mLs Intravenous Stopped 12/05/21 1350)  ? ? ?ED Course/ Medical Decision Making/ A&P ?Clinical Course as of 12/05/21 1900  ?Sat Dec 05, 2021  ?1226 Patient became diaphoretic and vomited blood after TXA being put in his left nare.  He denies any chest pain, no shortness of breath.  Did not lose consciousness, nothing like this is ever happened before.  He denies any abdominal pain, no blood in the stool that he has noticed a dark and tarry stool.  He is diaphoretic in the room, the blood is bright red.  Tripoding, increased work of breathing. [HS]  ?1241 Left nares packed [MT]  ?1337 This is a 47 male came to the ED with a nosebleed that started this morning, initially from both naris but seems to be primarily from the left naris.  We were not able to visualize a anterior source of bleeding.  He was swallowing blood and then had an episode became diaphoretic and vomited blood.  Start was initially elevated.  He was given some fluids, pain medications.  His left nares was packed with a Rhino Rocket (10 cm), inflated.  Hemoglobin level 10.9.  IV fluids given.  Will monitor, if bleeding stable could be discharged with  packing in place [MT]  ?1542 Rechecking hgb now after IV fluids, likely will have some dilution, will observe 4 hours in ED further and repeat hgb again.  He has a small trickle around his packing, but does not feel that he is swallowing blood.  If he has persistent bleeding or Melvenia Beam  count is dropping significantly in the evening, he may need admission to the hospital and possible double packing. [MT]  ?  ?Clinical Course User Index ?[HS] Theron AristaSage, Chaniah Cisse, PA-C ?[MT] Terald Sleeperrifan, Matthew J, MD  ? ?                        ?Medical Decision Making ?Amount and/or Complexity of Data Reviewed ?Labs: ordered. ?Radiology: ordered. ? ?Risk ?Prescription drug management. ? ? ?Patient is a 56 year old male presenting with left epistaxis.  Differential diagnosis includes but is not limited to coagulopathy, anterior epistaxis, posterior epistaxis. ? ?Unable to actually visualize the source of left nare epistaxis.  Attempted pressure, Afrin and TXA without any relief.  Anterior packing performed by my attending. ? ?On reevaluation patient became diaphoretic.  He denied any chest pain but did feel short of breath.  He also was spitting up blood which is likely from the epistaxis.  Based on this broader work-up was initiated. ? ?I ordered, viewed and interpreted laboratory work-up. ?-Patient appears hemoconcentrated on initial CBC.  Second CBC shows drop in hemoglobin from 10.9 to 9.6.  Second third CBC pending at time of shift change. ?- PT/INR within normal limits. ?- BMP shows slight elevation in BUN, hyperglycemia but no DKA today. ?Increase in bilirubin, liver enzymes within normal limits. ?- Third CBC pending at time of shift change. ? ?Patient was given fluid bolus given the episode of being lightheaded.  Also given Dilaudid for pain.  On reevaluation patient's symptoms have improved.  He has been on cardiac monitoring without any duration, no hypotension or new tachycardia. ? ?Care discussed with Lyndel SafeElizabeth Hammond at shift change.   Patient was also seen by my attending earlier today and he is in agreement with the plan.  Plan is to repeat CBC, if his hemoglobin is stable he likely will be appropriate for ENT follow-up in the next few days to

## 2021-12-05 NOTE — ED Provider Notes (Signed)
I assumed care of patient at shift change from previous team, please see their note for full H&P. ?Briefly patient is here for evaluation of epistaxis.  He had an initial CBC and a repeat CBC however he did get some IV fluids. ?At the time of shift change plan is to follow-up on third CBC. ?As long as it is stable anticipate discharge with close outpatient follow-up. ? ?ED Course / MDM  ? ?Clinical Course as of 12/05/21 1849  ?Sat Dec 05, 2021  ?1226 Patient became diaphoretic and vomited blood after TXA being put in his left nare.  He denies any chest pain, no shortness of breath.  Did not lose consciousness, nothing like this is ever happened before.  He denies any abdominal pain, no blood in the stool that he has noticed a dark and tarry stool.  He is diaphoretic in the room, the blood is bright red.  Tripoding, increased work of breathing. [HS]  ?1241 Left nares packed [MT]  ?1337 This is a 58 male came to the ED with a nosebleed that started this morning, initially from both naris but seems to be primarily from the left naris.  We were not able to visualize a anterior source of bleeding.  He was swallowing blood and then had an episode became diaphoretic and vomited blood.  Start was initially elevated.  He was given some fluids, pain medications.  His left nares was packed with a Rhino Rocket (10 cm), inflated.  Hemoglobin level 10.9.  IV fluids given.  Will monitor, if bleeding stable could be discharged with packing in place [MT]  ?1542 Rechecking hgb now after IV fluids, likely will have some dilution, will observe 4 hours in ED further and repeat hgb again.  He has a small trickle around his packing, but does not feel that he is swallowing blood.  If he has persistent bleeding or Simon count is dropping significantly in the evening, he may need admission to the hospital and possible double packing. [MT]  ?  ?Clinical Course User Index ?[HS] Theron Arista, PA-C ?[MT] Terald Sleeper, MD  ? ?Labs Reviewed   ?BASIC METABOLIC PANEL - Abnormal; Notable for the following components:  ?    Result Value  ? CO2 21 (*)   ? Glucose, Bld 142 (*)   ? BUN 25 (*)   ? Calcium 8.4 (*)   ? All other components within normal limits  ?CBC WITH DIFFERENTIAL/PLATELET - Abnormal; Notable for the following components:  ? WBC 12.7 (*)   ? RBC 3.24 (*)   ? Hemoglobin 10.9 (*)   ? HCT 34.1 (*)   ? MCV 105.2 (*)   ? Lymphs Abs 5.3 (*)   ? Monocytes Absolute 1.3 (*)   ? All other components within normal limits  ?HEPATIC FUNCTION PANEL - Abnormal; Notable for the following components:  ? Total Protein 6.4 (*)   ? Total Bilirubin 1.5 (*)   ? Indirect Bilirubin 1.3 (*)   ? All other components within normal limits  ?CBC - Abnormal; Notable for the following components:  ? WBC 12.3 (*)   ? RBC 2.79 (*)   ? Hemoglobin 9.6 (*)   ? HCT 29.4 (*)   ? MCV 105.4 (*)   ? MCH 34.4 (*)   ? All other components within normal limits  ?CBC - Abnormal; Notable for the following components:  ? WBC 12.6 (*)   ? RBC 2.60 (*)   ? Hemoglobin 9.0 (*)   ?  HCT 27.3 (*)   ? MCV 105.0 (*)   ? MCH 34.6 (*)   ? All other components within normal limits  ?CBG MONITORING, ED - Abnormal; Notable for the following components:  ? Glucose-Capillary 124 (*)   ? All other components within normal limits  ?PROTIME-INR  ?TYPE AND SCREEN  ? ? ? ?Medical Decision Making ?At the time I assumed care of the patient plan is to follow-up on third CBC, reevaluate patient for bleeding and determine disposition. ?Patient is reevaluated, he does not have any blood in his oropharynx, states he does not feel like he is continuing to bleed and does not feel like he is swallowing any blood.  His heart rate was intermittently elevated in the 90s.  His initial CBC had a white count of 12.7 with a hemoglobin of 10.9,, and platelets of 244 second CBC after he got a liter of IV fluids showed a white count of 12.3 and a hemoglobin of 9.6 with platelets of 203. ?His third CBC came back with a white count  of 12.6, hemoglobin of 9, and platelets of 181. ?I discussed options with patient.  While he did get a liter of fluid he had a hemoglobin drop of 1.9 g.  He was observed in the emergency room for 10 hours and his bleeding appears to have been controlled.  He is not endorsing any orthostatic changes, is not orthostatic on his vitals.  We discussed option given he did drop 1.9 g for admission to continue trending, versus discharge home.  We discussed risks and benefits of both of these options.  Patient adamantly wants to be discharged home, and does not want to be observed in the hospital. ?Given that he is not having orthostatic changes, is not anticoagulated, and appears reliable and it appears his bleeding is controlled at this time I feel that this is a reasonable decision. ? ?We discussed the importance of following up with ENT for packing removal.  Additionally he is given a dose of Augmentin while in the emergency room and a prescription for continued antibiotics at home as he is being sent home with packing in place. ? ?Return precautions were discussed with patient who states their understanding.  At the time of discharge patient denied any unaddressed complaints or concerns.  Patient is agreeable for discharge home. ? ?Note: Portions of this report may have been transcribed using voice recognition software. Every effort was made to ensure accuracy; however, inadvertent computerized transcription errors may be present ? ? ? ?Amount and/or Complexity of Data Reviewed ?Labs: ordered. ? ?Risk ?Prescription drug management. ? ? ? ? ? ? ?  ?Cristina Gong, [place] ?12/06/21 1331 ? ?  ?Sloan Leiter, DO ?12/07/21 0115 ? ?

## 2021-12-05 NOTE — ED Notes (Signed)
RN and primary RN called into patients room by family member. Patient was sitting at end of bed c/o feeling like he was going to pass out. Staff attempted to assist patient back into bed. Patient refused and requested his shirt to be removed. Primary RN went to grab EDP while this RN helped patient remove his shirt. Patient began vomiting blood and fell forward onto RN. Patient was assisted backwards onto bed by RN and family member. EDP and more staff came in to assist.  ?

## 2021-12-05 NOTE — ED Triage Notes (Signed)
Patient brought in via EMS from home. Alert and oriented. Airway patent. Patient c/o nosebleed that started this morning at 07:30 from both nares. Denies taking any type of anticoagulant, does take blood pressure medication in which was changed 2 weeks ago to metoprolol. Patient given afrin spray x2 before arrival to ED.   ?

## 2021-12-05 NOTE — ED Notes (Signed)
Pt ambulated to restroom at this time without incident. Mike Keller 

## 2021-12-05 NOTE — ED Notes (Signed)
Pt noted with bleeding around area of packing that EDP placed in left nostril. Blood contained in packing still at this time but noted to be increased within gauze. Assessed right nostril, minimal blood noted deep into nasal cavity but no dripping out of right nostril. No post nasal drip or bleeding noted in back of throat. EDP updated. Plan to continue monitoring CBC/H&H at this time. Pt denies any dizziness, pain, or episodes of vomiting. Walked with steady gate to restroom. Instructed to keep someone with him when ambulating and going to RR at this time incase another syncopal episode occurs  ?

## 2021-12-05 NOTE — ED Provider Notes (Signed)
.  Epistaxis Management ? ?Date/Time: 12/05/2021 3:44 PM ?Performed by: Terald Sleeper, MD ?Authorized by: Terald Sleeper, MD  ? ?Consent:  ?  Consent obtained:  Verbal ?  Consent given by:  Patient ?  Risks, benefits, and alternatives were discussed: yes   ?  Risks discussed:  Bleeding and infection ?  Alternatives discussed:  No treatment ?Universal protocol:  ?  Procedure explained and questions answered to patient or proxy's satisfaction: yes   ?  Immediately prior to procedure, a time out was called: yes   ?  Patient identity confirmed:  Arm band ?Anesthesia:  ?  Anesthesia method:  Topical application ?  Topical anesthetic:  Lidocaine gel ?Procedure details:  ?  Treatment site:  L posterior and L anterior ?  Treatment method:  Nasal balloon ?  Treatment complexity:  Limited ?  Treatment episode: recurring   ?Post-procedure details:  ?  Assessment:  Bleeding decreased ?  Procedure completion:  Tolerated well, no immediate complications ? ?  ?Terald Sleeper, MD ?12/05/21 1544 ? ?

## 2021-12-05 NOTE — ED Notes (Signed)
Patient in bed , skin moist and clammy. Denies shortness of breath and chest pain. ?

## 2021-12-05 NOTE — ED Notes (Signed)
Pt. is resting in bed with family members at his bedside. Denies any pain at present. Noted small amount of bleeding to left nares around nasal packing. Packing  remains intact. Notified EDP. New order given for CBC. ?

## 2021-12-05 NOTE — Discharge Instructions (Addendum)
Follow-up with Dr. Benjamine Mola with ear, nose and throat to have the packing removed in 3 days.  Call office Monday morning to schedule an appointment. ? ?As we discussed today your blood counts did drop slightly and your heart rate has been consistently elevated. ?We discussed the option of admission for continued monitoring versus discharge home.  We discussed risks and benefits of both of these options and you elected for discharge home. ?If you think you may be having any bleeding you need to return to the hospital immediately.  You can consider going to [facility] in [place] or you can always come back here. ?

## 2021-12-11 ENCOUNTER — Emergency Department (HOSPITAL_COMMUNITY)
Admission: EM | Admit: 2021-12-11 | Discharge: 2021-12-11 | Disposition: A | Discharge status: Home / Self Care | Payer: Self-pay | Attending: Emergency Medicine | Admitting: Emergency Medicine

## 2021-12-11 ENCOUNTER — Encounter (HOSPITAL_COMMUNITY): Payer: Self-pay

## 2021-12-11 ENCOUNTER — Other Ambulatory Visit: Payer: Self-pay

## 2021-12-11 DIAGNOSIS — Z79899 Other long term (current) drug therapy: Secondary | ICD-10-CM | POA: Insufficient documentation

## 2021-12-11 DIAGNOSIS — R040 Epistaxis: Secondary | ICD-10-CM | POA: Insufficient documentation

## 2021-12-11 NOTE — ED Provider Notes (Signed)
?[name] ?Provider Note ? ? ?CSN: [id number] ?Arrival date & time: 12/11/21  0646 ? ?  ? ?History ? ?Chief Complaint  ?Patient presents with  ? Epistaxis  ? ? ?Mike Keller is a 56 y.o. male. ? ? ?Epistaxis ? ?Patient presented to the ED for evaluation of recurrent nosebleed.  Patient states he has been having symptoms off and on for the last week.  He was seen in the emergency room on April 1 and was treated with nasal packing.  He ended up having some recurrent episodes and was seen in another ED.  Records reviewed from that visit.  Patient subsequently followed up with ENT physician.  He did have the packing removed a few days ago.  He had not had any issues until this morning when he woke up he felt blood in the back of his throat.  He was concerned so he came to the ED.  Patient is not having any bleeding at this time.  He was given 1 Afrin nasal spray in each nares prior to arrival. ?Patient denies any fevers or chills.  No vomiting or diarrhea ?Home Medications ?Prior to Admission medications   ?Medication Sig Start Date End Date Taking? Authorizing Provider  ?amLODipine (NORVASC) 5 MG tablet Take 1 tablet (5 mg total) by mouth daily. 09/03/21   Jacquelin Hawking, PA-C  ?amoxicillin-clavulanate (AUGMENTIN) 875-125 MG tablet Take 1 tablet by mouth every 12 (twelve) hours. 12/05/21   Cristina Gong, PA-C  ?HYDROcodone-acetaminophen (NORCO/VICODIN) 5-325 MG tablet Take 1 tablet by mouth every 4 (four) hours as needed. ?Patient not taking: Reported on 12/05/2021 11/23/21   Jacalyn Lefevre, MD  ?ibuprofen (ADVIL) 600 MG tablet Take 1 tablet (600 mg total) by mouth every 6 (six) hours as needed. 11/23/21   Jacalyn Lefevre, MD  ?metoprolol succinate (TOPROL-XL) 25 MG 24 hr tablet Take 1 tablet (25 mg total) by mouth daily. 11/23/21   Jacalyn Lefevre, MD  ?predniSONE (STERAPRED UNI-PAK 21 TAB) 10 MG (21) TBPK tablet Take by mouth daily. Take 6 tabs by mouth daily  for 2 days, then 5 tabs for 2  days, then 4 tabs for 2 days, then 3 tabs for 2 days, 2 tabs for 2 days, then 1 tab by mouth daily for 2 days 11/23/21   Jacalyn Lefevre, MD  ?   ? ?Allergies    ?Lisinopril   ? ?Review of Systems   ?Review of Systems  ?HENT:  Positive for nosebleeds.   ? ?Physical Exam ?Updated Vital Signs ?BP (!) 131/91 (BP Location: Left Arm)   Pulse 96   Temp 97.7 ?F (36.5 ?C) (Oral)   Resp 18   Ht 1.676 m (5\' 6" )   Wt 59 kg   SpO2 98%   BMI 20.98 kg/m?  ?Physical Exam ?Vitals and nursing note reviewed.  ?Constitutional:   ?   General: He is not in acute distress. ?   Appearance: He is well-developed.  ?HENT:  ?   Head: Normocephalic and atraumatic.  ?   Right Ear: External ear normal.  ?   Left Ear: External ear normal.  ?   Nose:  ?   Comments: No bleeding noted either nostril, no blood noted in oropharynx, no clots noted in the nares, hyperemia noted in the anterior nasal septum ?Eyes:  ?   General: No scleral icterus.    ?   Right eye: No discharge.     ?   Left eye: No discharge.  ?  Conjunctiva/sclera: Conjunctivae normal.  ?Neck:  ?   Trachea: No tracheal deviation.  ?Cardiovascular:  ?   Rate and Rhythm: Normal rate.  ?Pulmonary:  ?   Effort: Pulmonary effort is normal. No respiratory distress.  ?   Breath sounds: No stridor.  ?Abdominal:  ?   General: There is no distension.  ?Musculoskeletal:     ?   General: No swelling or deformity.  ?   Cervical back: Neck supple.  ?Skin: ?   General: Skin is warm and dry.  ?   Findings: No rash.  ?Neurological:  ?   Mental Status: He is alert.  ?   Cranial Nerves: Cranial nerve deficit: no gross deficits.  ? ? ?ED Results / Procedures / Treatments   ?Labs ?(all labs ordered are listed, but only abnormal results are displayed) ?Labs Reviewed - No data to display ? ?EKG ?None ? ?Radiology ?No results found. ? ?Procedures ?Procedures  ? ? ?Medications Ordered in ED ?Medications - No data to display ? ?ED Course/ Medical Decision Making/ A&P ?  ?                        ?Medical  Decision Making ?Patient with recurrent epistaxis.  No signs of any active bleeding at this time.  No indications for nasal packing or further intervention.  Discussed avoiding irritation to his nose.  Recommend outpatient follow-up with his ENT doctor.  Discussed direct pressure if he has any recurrent bleeding and to return if it does not resolve within 15 to 20 minutes. ? ? ? ? ? ? ?Final Clinical Impression(s) / ED Diagnoses ?Final diagnoses:  ?Epistaxis  ? ? ?Rx / DC Orders ?ED Discharge Orders   ? ? None  ? ?  ? ? ?  ?Linwood Dibbles, MD ?12/11/21 [phone] ? ?

## 2021-12-11 NOTE — Discharge Instructions (Addendum)
Pinch the front of your nose if you have any recurrent bleeding.  Hold for at least 15 minutes.  Return if that does not help with the bleeding persist.  Contact your ENT doctor to be rechecked if you continue to have episodes. ?

## 2021-12-11 NOTE — ED Triage Notes (Signed)
Pt to ED via [facility].  Pt has had intermittent nosebleed for past week. Pt was seen for same on 4/1, saw ENT 3 days ago and had not had bleeding since until this am.  Pt A&Ox4, airway patent on arrival.  Pt had active bleeding with clots per EMS. Pt received 1 spray of afrin per each nostril PTA.  ?

## 2021-12-15 ENCOUNTER — Other Ambulatory Visit (HOSPITAL_COMMUNITY)
Admission: RE | Admit: 2021-12-15 | Discharge: 2021-12-15 | Disposition: A | Discharge status: Home / Self Care | Payer: Self-pay | Source: Ambulatory Visit | Attending: Physician Assistant | Admitting: Physician Assistant

## 2021-12-15 ENCOUNTER — Encounter: Payer: Self-pay | Admitting: Physician Assistant

## 2021-12-15 ENCOUNTER — Ambulatory Visit: Payer: Self-pay | Admitting: Physician Assistant

## 2021-12-15 VITALS — BP 143/94 | HR 98 | Temp 97.3°F | Wt 126.0 lb

## 2021-12-15 DIAGNOSIS — D649 Anemia, unspecified: Secondary | ICD-10-CM | POA: Insufficient documentation

## 2021-12-15 DIAGNOSIS — R040 Epistaxis: Secondary | ICD-10-CM | POA: Insufficient documentation

## 2021-12-15 LAB — HEMOGLOBIN AND HEMATOCRIT, BLOOD
HCT: 26 % — ABNORMAL LOW (ref 39.0–52.0)
Hemoglobin: 8.8 g/dL — ABNORMAL LOW (ref 13.0–17.0)

## 2021-12-15 MED ORDER — METOPROLOL SUCCINATE ER 25 MG PO TB24
25.0000 mg | ORAL_TABLET | Freq: Every day | ORAL | 0 refills | Status: DC
Start: 2021-12-15 — End: 2022-01-06

## 2021-12-15 NOTE — Progress Notes (Signed)
? ?BP (!) 143/94   Pulse 98   Temp (!) 97.3 ?F (36.3 ?C)   Wt 126 lb (57.2 kg)   SpO2 99%   BMI 20.34 kg/m?   ? ?Subjective:  ? ? Patient ID: Mike Keller, male    DOB: May 31, 1966, 56 y.o.   MRN: [id number] ? ?HPI: ?Mike Keller is a 56 y.o. male presenting on 12/15/2021 for Epistaxis (Issues with bleeding since 12/05/21.) ? ? ?HPI ? ?Chief Complaint  ?Patient presents with  ? Epistaxis  ?  Issues with bleeding since 12/05/21.  ? ? ? ? ?He is still working at [employer].  It was going well until he got the nosebleeds and he hasn't been to work since then.   ? ?He had epistasis 7 yr ago and 1 trip to ER fixed it.  No return of the nose bleeding until 12/05/21.  Nothing he is aware of started it.   Of note, he was seen in ER ten days prior to onset of epistaxis- was diagnosed with angioedema, had calcium channel blocker discontinued, was prescribed prednisone and IBU 600.  Pt continues to take the IBU every other day.    ? ?He has been to ER 4 times.   He saw ENT last Tuesday- Dr Suszanne Conners.    He says ENT just told him to call back if he started bleeding again.   ? ?He doesn't have insurance yet- maybe October he thinks.   ? ?Pt says Last bleeding was this mornign at 6am.    He is having bleeding once or twice every day.    ? ?He last H/H is low-   ?Hg: ?07/2020- 14.2 ?12/05/2021 (12noon)  10.9 ?12/05/2021 (7:41pm) 9.0 ? ? ?He is taking IBU 600 every 2 or 3 days.  He is taking it for nose pain.   ? ?Pt is getting light-headed with position changes and feels weak. ? ? ? ? ? ?Relevant past medical, surgical, family and social history reviewed and updated as indicated. Interim medical history since our last visit reviewed. ?Allergies and medications reviewed and updated. ? ? ? ?Current Outpatient Medications:  ?  amoxicillin-clavulanate (AUGMENTIN) 875-125 MG tablet, Take 1 tablet by mouth every 12 (twelve) hours., Disp: 14 tablet, Rfl: 0 ?  ibuprofen (ADVIL) 600 MG tablet, Take 1 tablet (600 mg total) by mouth every 6 (six) hours as  needed., Disp: 30 tablet, Rfl: 0 ?  metoprolol succinate (TOPROL-XL) 25 MG 24 hr tablet, Take 1 tablet (25 mg total) by mouth daily., Disp: 30 tablet, Rfl: 0 ? ? ? ?Review of Systems ? ?Per HPI unless specifically indicated above ? ?   ?Objective:  ?  ?BP (!) 143/94   Pulse 98   Temp (!) 97.3 ?F (36.3 ?C)   Wt 126 lb (57.2 kg)   SpO2 99%   BMI 20.34 kg/m?   ?Wt Readings from Last 3 Encounters:  ?12/15/21 126 lb (57.2 kg)  ?12/11/21 130 lb (59 kg)  ?12/05/21 136 lb (61.7 kg)  ?  ?Physical Exam ?Vitals reviewed.  ?Constitutional:   ?   General: He is not in acute distress. ?   Appearance: He is well-developed. He is not ill-appearing.  ?HENT:  ?   Head: Normocephalic and atraumatic.  ?   Nose: No nasal deformity.  ?   Comments: Fresh clot left nare ?Cardiovascular:  ?   Rate and Rhythm: Normal rate and regular rhythm.  ?Pulmonary:  ?   Effort: Pulmonary effort is normal.  ?  Breath sounds: Normal breath sounds. No wheezing.  ?Abdominal:  ?   General: Bowel sounds are normal.  ?   Palpations: Abdomen is soft.  ?   Tenderness: There is no abdominal tenderness.  ?Musculoskeletal:  ?   Cervical back: Neck supple.  ?   Right lower leg: No edema.  ?   Left lower leg: No edema.  ?Lymphadenopathy:  ?   Cervical: No cervical adenopathy.  ?Skin: ?   General: Skin is warm and dry.  ?Neurological:  ?   Mental Status: He is alert and oriented to person, place, and time.  ?Psychiatric:     ?   Behavior: Behavior normal.  ? ? ? ? ? ? ?   ?Assessment & Plan:  ? ?Encounter Diagnoses  ?Name Primary?  ? Epistaxis Yes  ? Anemia, unspecified type   ? ? ? ?-Check h/h today to assess anemia ?-pt is urged to contact ENT for follow up.  He was offered referral to [facility] where he has financial assistance but he declined and said he would continue with Dr Suszanne Conners ?-pt is to finish his augmentin ?-pt is encouraged to stop the IBU.  He can use APAP prn ?-pt is to continue metoprolol for htn ?-pt to follow up 3 weeks.  He is to contact office for  worsening or go to ER for uncontrolled bleeding ?

## 2021-12-15 NOTE — Patient Instructions (Addendum)
Blood test/labs -TODAY ? ?Stop the Ibuprofen ?Finish the amoxicillin ?Continue the metoprolol  ? ? ?Call ENT back for follow up appointment ? ? ? ?

## 2022-01-06 ENCOUNTER — Ambulatory Visit: Payer: Self-pay | Admitting: Physician Assistant

## 2022-01-06 ENCOUNTER — Other Ambulatory Visit (HOSPITAL_COMMUNITY)
Admission: RE | Admit: 2022-01-06 | Discharge: 2022-01-06 | Disposition: A | Discharge status: Home / Self Care | Payer: Self-pay | Source: Ambulatory Visit | Attending: Physician Assistant | Admitting: Physician Assistant

## 2022-01-06 ENCOUNTER — Encounter: Payer: Self-pay | Admitting: Physician Assistant

## 2022-01-06 VITALS — BP 148/90 | HR 78 | Temp 97.4°F | Wt 131.6 lb

## 2022-01-06 DIAGNOSIS — D649 Anemia, unspecified: Secondary | ICD-10-CM

## 2022-01-06 DIAGNOSIS — I10 Essential (primary) hypertension: Secondary | ICD-10-CM

## 2022-01-06 LAB — HEMOGLOBIN AND HEMATOCRIT, BLOOD
HCT: 33.6 % — ABNORMAL LOW (ref 39.0–52.0)
Hemoglobin: 10.8 g/dL — ABNORMAL LOW (ref 13.0–17.0)

## 2022-01-06 MED ORDER — METOPROLOL SUCCINATE ER 50 MG PO TB24: 50.0000 mg | ORAL_TABLET | Freq: Every day | ORAL | 3 refills | Status: DC

## 2022-01-06 NOTE — Progress Notes (Signed)
? ?  BP (!) 148/90   Pulse 78   Temp (!) 97.4 ?F (36.3 ?C)   Wt 131 lb 9.6 oz (59.7 kg)   SpO2 95%   BMI 21.24 kg/m?   ? ?Subjective:  ? ? Patient ID: Mike Keller, male    DOB: 12/06/1965, 56 y.o.   MRN: [id number] ? ?HPI: ?Mike Keller is a 56 y.o. male presenting on 01/06/2022 for Hypertension and Anemia ? ? ?HPI ? ? ?Chief Complaint  ?Patient presents with  ? Hypertension  ? Anemia  ? ? ? ?Pt says he hasn't had any more nosebleeds since he saw dr Suszanne Conners on 12/18/21.  He is feeling better. ? ? ? ?Relevant past medical, surgical, family and social history reviewed and updated as indicated. Interim medical history since our last visit reviewed. ?Allergies and medications reviewed and updated. ? ? ?Current Outpatient Medications:  ?  metoprolol succinate (TOPROL-XL) 25 MG 24 hr tablet, Take 1 tablet (25 mg total) by mouth daily., Disp: 30 tablet, Rfl: 0 ? ? ? ?Review of Systems ? ?Per HPI unless specifically indicated above ? ?   ?Objective:  ?  ?BP (!) 148/90   Pulse 78   Temp (!) 97.4 ?F (36.3 ?C)   Wt 131 lb 9.6 oz (59.7 kg)   SpO2 95%   BMI 21.24 kg/m?   ?Wt Readings from Last 3 Encounters:  ?01/06/22 131 lb 9.6 oz (59.7 kg)  ?12/15/21 126 lb (57.2 kg)  ?12/11/21 130 lb (59 kg)  ?  ?Physical Exam ?Vitals reviewed.  ?Constitutional:   ?   General: He is not in acute distress. ?   Appearance: He is well-developed. He is not ill-appearing.  ?HENT:  ?   Head: Normocephalic and atraumatic.  ?Cardiovascular:  ?   Rate and Rhythm: Normal rate and regular rhythm.  ?Pulmonary:  ?   Effort: Pulmonary effort is normal.  ?   Breath sounds: Normal breath sounds. No wheezing.  ?Abdominal:  ?   General: Bowel sounds are normal.  ?   Palpations: Abdomen is soft.  ?   Tenderness: There is no abdominal tenderness.  ?Musculoskeletal:  ?   Cervical back: Neck supple.  ?   Right lower leg: No edema.  ?   Left lower leg: No edema.  ?Lymphadenopathy:  ?   Cervical: No cervical adenopathy.  ?Skin: ?   General: Skin is warm and dry.   ?Neurological:  ?   Mental Status: He is alert and oriented to person, place, and time.  ?Psychiatric:     ?   Behavior: Behavior normal.  ? ? ?Results for orders placed or performed during the hospital encounter of 12/15/21  ?Hemoglobin and hematocrit, blood  ?Result Value Ref Range  ? Hemoglobin 8.8 (L) 13.0 - 17.0 g/dL  ? HCT 26.0 (L) 39.0 - 52.0 %  ? ?   ?Assessment & Plan:  ? ? ?Encounter Diagnoses  ?Name Primary?  ? Anemia, unspecified type Yes  ? Primary hypertension   ? ? ?-Increase toprol for inadequately controlled HTN ?-recheck h/h ?-follow up 1 month to recheck bp.  He is to contact office sooner prn ?

## 2022-02-08 ENCOUNTER — Ambulatory Visit: Payer: Self-pay | Admitting: Physician Assistant

## 2022-02-24 ENCOUNTER — Ambulatory Visit: Payer: Self-pay | Admitting: Physician Assistant

## 2022-02-24 ENCOUNTER — Encounter: Payer: Self-pay | Admitting: Physician Assistant

## 2022-02-24 VITALS — BP 138/90 | HR 76 | Temp 97.6°F | Wt 130.4 lb

## 2022-02-24 DIAGNOSIS — I10 Essential (primary) hypertension: Secondary | ICD-10-CM

## 2022-02-24 DIAGNOSIS — D649 Anemia, unspecified: Secondary | ICD-10-CM

## 2022-02-24 DIAGNOSIS — F17200 Nicotine dependence, unspecified, uncomplicated: Secondary | ICD-10-CM

## 2022-02-24 MED ORDER — METOPROLOL SUCCINATE ER 100 MG PO TB24: 100.0000 mg | ORAL_TABLET | Freq: Every day | ORAL | 3 refills | Status: DC

## 2022-02-24 NOTE — Progress Notes (Signed)
   BP 138/90   Pulse 76   Temp 97.6 F (36.4 C)   Wt 130 lb 6.4 oz (59.1 kg)   SpO2 99%   BMI 21.05 kg/m    Subjective:    Patient ID: Mike Keller, male    DOB: [birth year], 56 y.o.   MRN: [id number]  HPI: Mike Keller is a 56 y.o. male presenting on 02/24/2022 for Hypertension   HPI  Chief Complaint  Patient presents with   Hypertension    Pt is doing well.  He has had no more nosebleeds.  He has no complaints today.    Relevant past medical, surgical, family and social history reviewed and updated as indicated. Interim medical history since our last visit reviewed. Allergies and medications reviewed and updated.    Current Outpatient Medications:    metoprolol succinate (TOPROL-XL) 50 MG 24 hr tablet, Take 1 tablet (50 mg total) by mouth daily. Take with or immediately following a meal., Disp: 30 tablet, Rfl: 3    Review of Systems  Per HPI unless specifically indicated above     Objective:    BP 138/90   Pulse 76   Temp 97.6 F (36.4 C)   Wt 130 lb 6.4 oz (59.1 kg)   SpO2 99%   BMI 21.05 kg/m   Wt Readings from Last 3 Encounters:  02/24/22 130 lb 6.4 oz (59.1 kg)  01/06/22 131 lb 9.6 oz (59.7 kg)  12/15/21 126 lb (57.2 kg)    Physical Exam Vitals reviewed.  Constitutional:      General: He is not in acute distress.    Appearance: He is well-developed. He is not ill-appearing.  HENT:     Head: Normocephalic and atraumatic.  Cardiovascular:     Rate and Rhythm: Normal rate and regular rhythm.  Pulmonary:     Effort: Pulmonary effort is normal.     Breath sounds: Normal breath sounds. No wheezing.  Abdominal:     General: Bowel sounds are normal.     Palpations: Abdomen is soft.     Tenderness: There is no abdominal tenderness.  Musculoskeletal:     Cervical back: Neck supple.     Right lower leg: No edema.     Left lower leg: No edema.  Lymphadenopathy:     Cervical: No cervical adenopathy.  Skin:    General: Skin is warm and dry.   Neurological:     Mental Status: He is alert and oriented to person, place, and time.  Psychiatric:        Behavior: Behavior normal.           Assessment & Plan:    Encounter Diagnoses  Name Primary?   Primary hypertension Yes   Anemia, unspecified type    Tobacco use disorder      -will increase metoprolol for better bp control -encouraged smoking cessation -encouraged pt to get covid vaccination -pt to follow up 6 weeks to recheck bp and anemia.  He is to contact office sooner prn

## 2022-02-24 NOTE — Patient Instructions (Signed)
COVID-19 vaccination significantly lowers your risk of severe illness, hospitalization, and death if you get infected. Compared to people who are up to date with their COVID-19 vaccinations, unvaccinated people aremore likely to get COVID-19, much more likely to be hospitalized with COVID-19, and much more likely to die from COVID-19. ?Like all vaccines, COVID-19 vaccines are not 100% effective at preventing infection. Some people who are up to date with their COVID-19 vaccinations will get COVID-19 breakthrough infection. However, staying up to date with your COVID-19 vaccinations means that you are less likely to have a breakthrough infection and, if you do get sick, you are less likely to get severely ill or die. Staying up to date with COVID-19 vaccination also means you are less likely to spread the disease to others and increases your protection against new variants of SARS-CoV-2, the virus that causes COVID-19. ? ?

## 2022-03-01 ENCOUNTER — Ambulatory Visit: Payer: Self-pay | Admitting: Physician Assistant

## 2022-03-01 ENCOUNTER — Encounter: Payer: Self-pay | Admitting: Physician Assistant

## 2022-03-01 ENCOUNTER — Other Ambulatory Visit (HOSPITAL_COMMUNITY)
Admission: RE | Admit: 2022-03-01 | Discharge: 2022-03-01 | Disposition: A | Discharge status: Home / Self Care | Payer: Self-pay | Attending: Physician Assistant | Admitting: Physician Assistant

## 2022-03-01 VITALS — BP 147/93 | HR 89 | Temp 97.1°F | Wt 129.0 lb

## 2022-03-01 DIAGNOSIS — I10 Essential (primary) hypertension: Secondary | ICD-10-CM | POA: Insufficient documentation

## 2022-03-01 DIAGNOSIS — D649 Anemia, unspecified: Secondary | ICD-10-CM | POA: Insufficient documentation

## 2022-03-01 DIAGNOSIS — F17200 Nicotine dependence, unspecified, uncomplicated: Secondary | ICD-10-CM

## 2022-03-01 LAB — BASIC METABOLIC PANEL
Anion gap: 11 (ref 5–15)
BUN: 9 mg/dL (ref 6–20)
CO2: 19 mmol/L — ABNORMAL LOW (ref 22–32)
Calcium: 8.4 mg/dL — ABNORMAL LOW (ref 8.9–10.3)
Chloride: 105 mmol/L (ref 98–111)
Creatinine, Ser: 0.83 mg/dL (ref 0.61–1.24)
GFR, Estimated: >60 mL/min (ref >60)
Glucose, Bld: 79 mg/dL (ref 70–99)
Potassium: 4.1 mmol/L (ref 3.5–5.1)
Sodium: 135 mmol/L (ref 135–145)

## 2022-03-01 LAB — CBC
HCT: 39.7 % (ref 39.0–52.0)
Hemoglobin: 13.5 g/dL (ref 13.0–17.0)
MCH: 30.2 pg (ref 26.0–34.0)
MCHC: 34 g/dL (ref 30.0–36.0)
MCV: 88.8 fL (ref 80.0–100.0)
Platelets: 177 10*3/uL (ref 150–400)
RBC: 4.47 MIL/uL (ref 4.22–5.81)
RDW: 15.7 % — ABNORMAL HIGH (ref 11.5–15.5)
WBC: 6.4 10*3/uL (ref 4.0–10.5)
nRBC: 0 % (ref 0.0–0.2)

## 2022-03-01 MED ORDER — AMLODIPINE BESYLATE 5 MG PO TABS: 5.0000 mg | ORAL_TABLET | Freq: Every day | ORAL | 1 refills | Status: DC

## 2022-03-02 ENCOUNTER — Encounter: Payer: Self-pay | Admitting: Physician Assistant

## 2022-03-10 ENCOUNTER — Ambulatory Visit: Payer: Self-pay | Admitting: Physician Assistant

## 2022-03-10 ENCOUNTER — Encounter: Payer: Self-pay | Admitting: Physician Assistant

## 2022-03-10 VITALS — BP 128/86 | HR 84 | Temp 98.0°F | Wt 129.0 lb

## 2022-03-10 DIAGNOSIS — I10 Essential (primary) hypertension: Secondary | ICD-10-CM

## 2022-03-10 NOTE — Progress Notes (Signed)
   BP 128/86   Pulse 84   Temp 98 F (36.7 C)   Wt 129 lb (58.5 kg)   SpO2 100%   BMI 20.82 kg/m    Subjective:    Patient ID: Mike Keller, male    DOB: [birth year], 56 y.o.   MRN: [id number]  HPI: Mike Keller is a 56 y.o. male presenting on 03/10/2022 for Hypertension   HPI  Chief Complaint  Patient presents with   Hypertension    Pt is feeling well today and has no complaints.    Relevant past medical, surgical, family and social history reviewed and updated as indicated. Interim medical history since our last visit reviewed. Allergies and medications reviewed and updated.   Current Outpatient Medications:    amLODipine (NORVASC) 5 MG tablet, Take 1 tablet (5 mg total) by mouth daily., Disp: 30 tablet, Rfl: 1   metoprolol succinate (TOPROL-XL) 50 MG 24 hr tablet, Take 50 mg by mouth daily. Take with or immediately following a meal., Disp: , Rfl:     Review of Systems  Per HPI unless specifically indicated above     Objective:    BP 128/86   Pulse 84   Temp 98 F (36.7 C)   Wt 129 lb (58.5 kg)   SpO2 100%   BMI 20.82 kg/m   Wt Readings from Last 3 Encounters:  03/10/22 129 lb (58.5 kg)  03/01/22 129 lb (58.5 kg)  02/24/22 130 lb 6.4 oz (59.1 kg)    Physical Exam Vitals reviewed.  Constitutional:      General: He is not in acute distress.    Appearance: He is well-developed. He is not toxic-appearing.  HENT:     Head: Normocephalic and atraumatic.  Cardiovascular:     Rate and Rhythm: Normal rate and regular rhythm.  Pulmonary:     Effort: Pulmonary effort is normal.     Breath sounds: Normal breath sounds. No wheezing.  Abdominal:     General: Bowel sounds are normal.     Palpations: Abdomen is soft.     Tenderness: There is no abdominal tenderness.  Musculoskeletal:     Cervical back: Neck supple.     Right lower leg: No edema.     Left lower leg: No edema.  Lymphadenopathy:     Cervical: No cervical adenopathy.  Skin:    General:  Skin is warm and dry.  Neurological:     Mental Status: He is alert and oriented to person, place, and time.  Psychiatric:        Behavior: Behavior normal.           Assessment & Plan:   Encounter Diagnosis  Name Primary?   Primary hypertension Yes      -pt encouraged to take his meds daily.  BP is good today -pt has appointment already scheduled for august.  He is to contact office sooner prn

## 2022-04-07 ENCOUNTER — Ambulatory Visit: Payer: Self-pay | Admitting: Physician Assistant

## 2022-04-07 ENCOUNTER — Encounter: Payer: Self-pay | Admitting: Physician Assistant

## 2022-04-07 VITALS — BP 120/84 | HR 83 | Temp 98.3°F | Wt 126.5 lb

## 2022-04-07 DIAGNOSIS — I10 Essential (primary) hypertension: Secondary | ICD-10-CM

## 2022-04-07 DIAGNOSIS — F17200 Nicotine dependence, unspecified, uncomplicated: Secondary | ICD-10-CM

## 2022-04-07 DIAGNOSIS — R911 Solitary pulmonary nodule: Secondary | ICD-10-CM

## 2022-04-07 MED ORDER — AMLODIPINE BESYLATE 5 MG PO TABS: 5.0000 mg | ORAL_TABLET | Freq: Every day | ORAL | 1 refills | Status: DC

## 2022-04-07 NOTE — Progress Notes (Signed)
   BP 120/84   Pulse 83   Temp 98.3 F (36.8 C)   Wt 126 lb 8 oz (57.4 kg)   SpO2 99%   BMI 20.42 kg/m    Subjective:    Patient ID: Mike Keller, male    DOB: 04/02/1966, 56 y.o.   MRN: [id number]  HPI: Mike Keller is a 56 y.o. male presenting on 04/07/2022 for Hypertension and Anemia   HPI  Chief Complaint  Patient presents with   Hypertension   Anemia    Pt says he is feeling really good.  He has had no nosebleeds.  Sometime after his last appointment, he stopped taking his metoprolol.     Relevant past medical, surgical, family and social history reviewed and updated as indicated. Interim medical history since our last visit reviewed. Allergies and medications reviewed and updated.   Current Outpatient Medications:    amLODipine (NORVASC) 5 MG tablet, Take 1 tablet (5 mg total) by mouth daily., Disp: 30 tablet, Rfl: 1   Review of Systems  Per HPI unless specifically indicated above     Objective:    BP 120/84   Pulse 83   Temp 98.3 F (36.8 C)   Wt 126 lb 8 oz (57.4 kg)   SpO2 99%   BMI 20.42 kg/m   Wt Readings from Last 3 Encounters:  04/07/22 126 lb 8 oz (57.4 kg)  03/10/22 129 lb (58.5 kg)  03/01/22 129 lb (58.5 kg)    Physical Exam Vitals reviewed.  Constitutional:      General: He is not in acute distress.    Appearance: He is well-developed. He is not ill-appearing.  HENT:     Head: Normocephalic and atraumatic.  Cardiovascular:     Rate and Rhythm: Normal rate and regular rhythm.  Pulmonary:     Effort: Pulmonary effort is normal.     Breath sounds: Normal breath sounds. No wheezing or rales.  Abdominal:     General: Bowel sounds are normal.     Palpations: Abdomen is soft. There is no mass.     Tenderness: There is no abdominal tenderness.  Musculoskeletal:     Cervical back: Neck supple.  Lymphadenopathy:     Cervical: No cervical adenopathy.  Skin:    General: Skin is warm and dry.     Findings: No rash.  Neurological:      Mental Status: He is alert and oriented to person, place, and time.  Psychiatric:        Behavior: Behavior normal.           Assessment & Plan:    Encounter Diagnoses  Name Primary?   Primary hypertension Yes   Tobacco use disorder    Pulmonary nodule      -pt to continue amlodipine for htn -repeat CT chest to follow up nodule -pt to follow up 3 months.  He is to RTO sooner prn

## 2022-05-04 ENCOUNTER — Ambulatory Visit (HOSPITAL_COMMUNITY): Admission: RE | Admit: 2022-05-04 | Payer: Self-pay | Source: Ambulatory Visit

## 2022-05-21 ENCOUNTER — Other Ambulatory Visit: Payer: Self-pay

## 2022-05-21 ENCOUNTER — Encounter (HOSPITAL_COMMUNITY): Payer: Self-pay | Admitting: *Deleted

## 2022-05-21 ENCOUNTER — Emergency Department (HOSPITAL_COMMUNITY)
Admission: EM | Admit: 2022-05-21 | Discharge: 2022-05-21 | Disposition: A | Discharge status: Home / Self Care | Payer: Self-pay | Attending: Emergency Medicine | Admitting: Emergency Medicine

## 2022-05-21 DIAGNOSIS — Z79899 Other long term (current) drug therapy: Secondary | ICD-10-CM | POA: Insufficient documentation

## 2022-05-21 DIAGNOSIS — I10 Essential (primary) hypertension: Secondary | ICD-10-CM | POA: Insufficient documentation

## 2022-05-21 DIAGNOSIS — J069 Acute upper respiratory infection, unspecified: Secondary | ICD-10-CM | POA: Insufficient documentation

## 2022-05-21 NOTE — Discharge Instructions (Signed)
Likely a viral infection, recommend over-the-counter pain medications like ibuprofen Tylenol for fever and pain control, nasal decongestions like Flonase and Zyrtec, Mucinex for cough.  If not eating recommend supplementing with Gatorade to help with electrolyte supplementation.  Follow-up PCP for further evaluation.  Come back to the emergency department if you develop chest pain, shortness of breath, severe abdominal pain, uncontrolled nausea, vomiting, diarrhea.  

## 2022-05-21 NOTE — ED Notes (Signed)
Patient verbalizes understanding of discharge instructions. Opportunity for questioning and answers were provided. Armband removed by staff, pt discharged from ED. Ambulated out to lobby  

## 2022-05-21 NOTE — ED Provider Notes (Signed)
[facility] EMERGENCY DEPARTMENT Provider Note   CSN: [id number] Arrival date & time: 05/21/22  1800     History  Chief Complaint  Patient presents with   Nasal Congestion    Christy A Dieujuste is a 56 y.o. male.  HPI   Patient with medical history including hypertension, angioedema, presents with complaints of URI-like symptoms, states that symptoms started on Monday, endorses some chills with a cough, nonproductive, no chest pain or shortness of breath, no stomach pain no nausea or vomiting, states that he feels as if he has coronavirus, he denies any recent sick contacts, he is up-to-date on his influenza and COVID-vaccine, he is not immunocompromise, he is tolerating p.o., he is not having any other complaints at this time.    Home Medications Prior to Admission medications   Medication Sig Start Date End Date Taking? Authorizing Provider  amLODipine (NORVASC) 5 MG tablet Take 1 tablet (5 mg total) by mouth daily. 04/07/22   Jacquelin Hawking, PA-C      Allergies    Lisinopril    Review of Systems   Review of Systems  Constitutional:  Positive for chills. Negative for fever.  Respiratory:  Positive for cough. Negative for shortness of breath.   Cardiovascular:  Negative for chest pain.  Gastrointestinal:  Negative for abdominal pain.  Neurological:  Negative for headaches.    Physical Exam Updated Vital Signs BP (!) 147/97   Pulse 78   Temp 97.8 F (36.6 C)   Resp 18   Ht 5\' 7"  (1.702 m)   Wt 59.9 kg   SpO2 97%   BMI 20.67 kg/m  Physical Exam Vitals and nursing note reviewed.  Constitutional:      General: He is not in acute distress.    Appearance: He is not ill-appearing.  HENT:     Head: Normocephalic and atraumatic.     Nose: No congestion.     Mouth/Throat:     Mouth: Mucous membranes are moist.     Pharynx: Oropharynx is clear. No oropharyngeal exudate or posterior oropharyngeal erythema.     Comments: No trismus no torticollis tongue uvula midline  controlling her secretions tonsils are both equal symmetrical bilaterally. Eyes:     Conjunctiva/sclera: Conjunctivae normal.  Cardiovascular:     Rate and Rhythm: Normal rate and regular rhythm.     Pulses: Normal pulses.     Heart sounds: No murmur heard.    No friction rub. No gallop.  Pulmonary:     Effort: No respiratory distress.     Breath sounds: No wheezing, rhonchi or rales.     Comments: No evidence of respiratory distress nontachypneic nonhypoxic, speaking full sentences, no assessor muscle usage, lung sounds clear bilaterally. Musculoskeletal:     Right lower leg: No edema.     Left lower leg: No edema.  Skin:    General: Skin is warm and dry.  Neurological:     Mental Status: He is alert.  Psychiatric:        Mood and Affect: Mood normal.     ED Results / Procedures / Treatments   Labs (all labs ordered are listed, but only abnormal results are displayed) Labs Reviewed - No data to display  EKG None  Radiology No results found.  Procedures Procedures    Medications Ordered in ED Medications - No data to display  ED Course/ Medical Decision Making/ A&P  Medical Decision Making  This patient presents to the ED for concern of URI, this involves an extensive number of treatment options, and is a complaint that carries with it a high risk of complications and morbidity.  The differential diagnosis includes pneumonia, PE, asthma exacerbation    Additional history obtained:  Additional history obtained from N/A External records from outside source obtained and reviewed including primary care notes   Co morbidities that complicate the patient evaluation  Hypertension  Social Determinants of Health:  N/A    Lab Tests:  I Ordered, and personally interpreted labs.  The pertinent results include: N/A   Imaging Studies ordered:  I ordered imaging studies including N/A I independently visualized and interpreted  imaging which showed n/a I agree with the radiologist interpretation   Cardiac Monitoring:  The patient was maintained on a cardiac monitor.  I personally viewed and interpreted the cardiac monitored which showed an underlying rhythm of: N/A   Medicines ordered and prescription drug management:  I ordered medication including N/A I have reviewed the patients home medicines and have made adjustments as needed  Critical Interventions:  N/A   Reevaluation:  Benign physical exam agreement with plan and discharge  Consultations Obtained:  N/A    Test Considered:  Respiratory panel-deferred by patient I find this reasonable as my plan would not change if he were COVID-positive or positive for influenza as he is outside the treatment window for influenza, he has very mild symptoms for COVID, he is no new oxygen requirements, do not feel he would benefit from antiviral treatment at this time.    Rule out Low suspicion for systemic infection as patient is nontoxic-appearing, vital signs reassuring, no obvious source infection noted on exam.  Low suspicion for pneumonia as lung sounds are clear bilaterally.  I have low suspicion for PE as patient denies pleuritic chest pain, shortness of breath, vital signs reassuring, presentation more consistent with URI. low suspicion for strep throat as oropharynx was visualized, no erythema or exudates noted.  Low suspicion patient would need  hospitalized due to viral infection or Covid as vital signs reassuring, patient is not in respiratory distress.      Dispostion and problem list  After consideration of the diagnostic results and the patients response to treatment, I feel that the patent would benefit from discharge.  URI-likely viral nature, will recommend symptom management, follow-up PCP for further evaluation and strict return precautions.            Final Clinical Impression(s) / ED Diagnoses Final diagnoses:  Upper  respiratory tract infection, unspecified type    Rx / DC Orders ED Discharge Orders     None         Barnie Del 05/21/22 2156    Linwood Dibbles, MD 05/22/22 [phone]

## 2022-05-21 NOTE — ED Triage Notes (Addendum)
Here for non-productive cough, body aches, wheezing, nasal congestion and runny nose. Onset Monday. No known fever, or chills. States, "think I have covid".

## 2022-06-08 ENCOUNTER — Other Ambulatory Visit: Payer: Self-pay | Admitting: Physician Assistant

## 2022-06-08 MED ORDER — AMLODIPINE BESYLATE 5 MG PO TABS
5.0000 mg | ORAL_TABLET | Freq: Every day | ORAL | 0 refills | Status: DC
Start: 2022-06-08 — End: 2022-07-06

## 2022-06-18 ENCOUNTER — Other Ambulatory Visit: Payer: Self-pay

## 2022-06-18 ENCOUNTER — Ambulatory Visit
Admission: RE | Admit: 2022-06-18 | Discharge: 2022-06-18 | Disposition: A | Discharge status: Home / Self Care | Payer: Self-pay | Source: Ambulatory Visit | Attending: Nurse Practitioner | Admitting: Nurse Practitioner

## 2022-06-18 VITALS — BP 146/89 | HR 106 | Temp 98.7°F | Resp 20

## 2022-06-18 DIAGNOSIS — K611 Rectal abscess: Secondary | ICD-10-CM

## 2022-06-18 MED ORDER — DOXYCYCLINE HYCLATE 100 MG PO TABS: 100.0000 mg | ORAL_TABLET | Freq: Two times a day (BID) | ORAL | 0 refills | Status: DC

## 2022-06-18 NOTE — ED Triage Notes (Signed)
Pt reports perirectal abscess x5 days. Pt reports site has drained but reports has stopped and is now hurting. Pt denies any fever, chills, nausea.

## 2022-06-18 NOTE — Discharge Instructions (Signed)
Take medication as prescribed. Warm compresses to the affected area 3-4 times daily. Clean the area at least twice daily with Dial Gold bar soap or another type of antibacterial soap. Keep the area covered if it begins to drain.. Go to the emergency department if you develop fever, chills, generalized fatigue, nausea, vomiting, or other concerns.

## 2022-06-18 NOTE — ED Provider Notes (Signed)
[facility]    CSN: [id number] Arrival date & time: 06/18/22  1259      History   Chief Complaint Chief Complaint  Patient presents with   Abscess    Entered by patient    HPI Mike Keller is a 56 y.o. male.   The history is provided by the patient.   Patient presents with his partner for complaints of an abscess to the left perirectal region that has been present for the past several days.  Patient states 6 days ago, the area drained, but he continues to have pain and tenderness to the site.  Patient states the area is no longer draining, but is very tender.  He denies fever, chills, chest pain, abdominal pain, nausea, vomiting, or diarrhea.  Patient states when the area drained, the drainage had a foul odor to it.  He states he has been using a topical antibiotic ointment to the area.  Past Medical History:  Diagnosis Date   Allergy    Asthma    as a child   Chronic back pain    Hypertension     Patient Active Problem List   Diagnosis Date Noted   Pulmonary nodule 03/16/2021   Tobacco abuse 04/20/2017   Angioedema due to angiotensin converting enzyme inhibitor (ACE-I)    Chronic back pain    Hypertension     Past Surgical History:  Procedure Laterality Date   HAND SURGERY     left   HAND SURGERY     HAND SURGERY Left        Home Medications    Prior to Admission medications   Medication Sig Start Date End Date Taking? Authorizing Provider  doxycycline (VIBRA-TABS) 100 MG tablet Take 1 tablet (100 mg total) by mouth 2 (two) times daily. 06/18/22  Yes Sujata Maines-Warren, Alda Lea, NP  amLODipine (NORVASC) 5 MG tablet Take 1 tablet (5 mg total) by mouth daily. 06/08/22   Soyla Dryer, PA-C    Family History Family History  Problem Relation Age of Onset   Asthma Mother    Hypertension Mother    Hypertension Father    Kidney disease Brother    Stroke Brother    Hypertension Brother     Social History Social History   Tobacco Use    Smoking status: Every Day    Packs/day: 1.00    Types: Cigarettes    Start date: 09/06/1993   Smokeless tobacco: Never  Vaping Use   Vaping Use: Never used  Substance Use Topics   Alcohol use: Yes    Alcohol/week: 7.0 standard drinks of alcohol    Types: 7 Cans of beer per week    Comment: beer, 1 cans daily   Drug use: Not Currently    Types: Marijuana    Comment: none since may 2022.     Allergies   Lisinopril   Review of Systems Review of Systems Per HPI  Physical Exam Triage Vital Signs ED Triage Vitals [06/18/22 1410]  Enc Vitals Group     BP (!) 146/89     Pulse Rate (!) 106     Resp 20     Temp 98.7 F (37.1 C)     Temp Source Oral     SpO2 96 %     Weight      Height      Head Circumference      Peak Flow      Pain Score 5     Pain  Loc      Pain Edu?      Excl. in GC?    No data found.  Updated Vital Signs BP (!) 146/89 (BP Location: Right Arm)   Pulse (!) 106   Temp 98.7 F (37.1 C) (Oral)   Resp 20   SpO2 96%   Visual Acuity Right Eye Distance:   Left Eye Distance:   Bilateral Distance:    Right Eye Near:   Left Eye Near:    Bilateral Near:     Physical Exam Vitals and nursing note reviewed.  Constitutional:      General: He is not in acute distress.    Appearance: Normal appearance.  Eyes:     Extraocular Movements: Extraocular movements intact.     Pupils: Pupils are equal, round, and reactive to light.  Musculoskeletal:     Cervical back: Normal range of motion.  Skin:    General: Skin is warm and dry.     Comments: Healing abscess noted to the perirectal region of the left buttock.  Area is firm, measures approximately 1 cm in diameter and is tender to touch.  There is no oozing, fluctuance, or drainage present.  Neurological:     General: No focal deficit present.     Mental Status: He is alert and oriented to person, place, and time.  Psychiatric:        Mood and Affect: Mood normal.        Behavior: Behavior normal.       UC Treatments / Results  Labs (all labs ordered are listed, but only abnormal results are displayed) Labs Reviewed - No data to display  EKG   Radiology No results found.  Procedures Procedures (including critical care time)  Medications Ordered in UC Medications - No data to display  Initial Impression / Assessment and Plan / UC Course  I have reviewed the triage vital signs and the nursing notes.  Pertinent labs & imaging results that were available during my care of the patient were reviewed by me and considered in my medical decision making (see chart for details).  Patient presents for complaints of an abscess to the rectal area that has been present for the last several days.  On exam, patient has a healing abscess noted to the left perirectal region or buttock.  The area is firm and tender to palpation.  There is no draining, oozing, or fluctuance present, area is not suitable for I&D.  We will treat patient with doxycycline 100 mg twice daily for 7 days.  Patient was also given supportive care recommendations to include warm compresses, cleansing the area with an antibacterial soap, and indications of when to go to the emergency department.  Patient's physical exam and vital signs are stable for discharge.  Patient verbalizes understanding with all questions answered. Final Clinical Impressions(s) / UC Diagnoses   Final diagnoses:  Abscess, perirectal     Discharge Instructions      Take medication as prescribed. Warm compresses to the affected area 3-4 times daily. Clean the area at least twice daily with Dial Gold bar soap or another type of antibacterial soap. Keep the area covered if it begins to drain.. Go to the emergency department if you develop fever, chills, generalized fatigue, nausea, vomiting, or other concerns.     ED Prescriptions     Medication Sig Dispense Auth. Provider   doxycycline (VIBRA-TABS) 100 MG tablet Take 1 tablet (100 mg  total) by mouth 2 (two)  times daily. 14 tablet Jonae Renshaw-Warren, Sadie Haber, NP      PDMP not reviewed this encounter.   Abran Cantor, NP 06/18/22 1424

## 2022-07-06 ENCOUNTER — Ambulatory Visit: Payer: Self-pay | Admitting: Physician Assistant

## 2022-07-06 ENCOUNTER — Encounter: Payer: Self-pay | Admitting: Physician Assistant

## 2022-07-06 ENCOUNTER — Telehealth: Payer: Self-pay | Admitting: Student

## 2022-07-06 VITALS — BP 120/78 | HR 93 | Temp 98.1°F | Wt 130.0 lb

## 2022-07-06 DIAGNOSIS — F17200 Nicotine dependence, unspecified, uncomplicated: Secondary | ICD-10-CM

## 2022-07-06 DIAGNOSIS — I10 Essential (primary) hypertension: Secondary | ICD-10-CM

## 2022-07-06 DIAGNOSIS — R911 Solitary pulmonary nodule: Secondary | ICD-10-CM

## 2022-07-06 MED ORDER — AMLODIPINE BESYLATE 5 MG PO TABS: 5.0000 mg | ORAL_TABLET | Freq: Every day | ORAL | 0 refills | Status: DC

## 2022-07-06 NOTE — Progress Notes (Signed)
   BP 120/78   Pulse 93   Temp 98.1 F (36.7 C)   Wt 130 lb (59 kg)   SpO2 98%   BMI 20.36 kg/m    Subjective:    Patient ID: Mike Keller, male    DOB: May 02, 1966, 56 y.o.   MRN: [id number]  HPI: RYDER MAN is a 56 y.o. male presenting on 07/06/2022 for Hypertension   HPI   Chief Complaint  Patient presents with   Hypertension    Pt says he is doing well.  He is working at [employer].  He has no complaints today except says that it is tiring working 3rd shift.   Relevant past medical, surgical, family and social history reviewed and updated as indicated. Interim medical history since our last visit reviewed. Allergies and medications reviewed and updated.    Current Outpatient Medications:    amLODipine (NORVASC) 5 MG tablet, Take 1 tablet (5 mg total) by mouth daily., Disp: 30 tablet, Rfl: 0     Review of Systems  Per HPI unless specifically indicated above     Objective:    BP 120/78   Pulse 93   Temp 98.1 F (36.7 C)   Wt 130 lb (59 kg)   SpO2 98%   BMI 20.36 kg/m   Wt Readings from Last 3 Encounters:  07/06/22 130 lb (59 kg)  05/21/22 132 lb (59.9 kg)  04/07/22 126 lb 8 oz (57.4 kg)    Physical Exam Vitals reviewed.  Constitutional:      General: He is not in acute distress.    Appearance: He is well-developed. He is not ill-appearing.  HENT:     Head: Normocephalic and atraumatic.  Cardiovascular:     Rate and Rhythm: Normal rate and regular rhythm.  Pulmonary:     Effort: Pulmonary effort is normal.     Breath sounds: Normal breath sounds. No wheezing.  Abdominal:     General: Bowel sounds are normal.     Palpations: Abdomen is soft.     Tenderness: There is no abdominal tenderness.  Musculoskeletal:     Cervical back: Neck supple.     Right lower leg: No edema.     Left lower leg: No edema.  Lymphadenopathy:     Cervical: No cervical adenopathy.  Skin:    General: Skin is warm and dry.  Neurological:     Mental Status: He is  alert and oriented to person, place, and time.  Psychiatric:        Behavior: Behavior normal.           Assessment & Plan:   Encounter Diagnoses  Name Primary?   Primary hypertension Yes   Tobacco use disorder    Pulmonary nodule       -Pt to continue current medication for HTN -pt counseled to Update enrollement as it expired in July. -needs to update chest CT for nodule -encouraged smoking cessation -pt to follow up 3 months.  He is to contact office sooner prn

## 2022-07-06 NOTE — Telephone Encounter (Signed)
LPN called and notified pt that it is time for repeat CT chest to f/u on nodules. Pt was informed he is to apply for CAFA. Pt verbalized understanding.  LPN will call pt back when CT is ordered and scheduled.

## 2022-07-07 ENCOUNTER — Ambulatory Visit (HOSPITAL_COMMUNITY)
Admission: RE | Admit: 2022-07-07 | Discharge: 2022-07-07 | Disposition: A | Discharge status: Home / Self Care | Payer: Self-pay | Source: Ambulatory Visit | Attending: Physician Assistant | Admitting: Physician Assistant

## 2022-07-07 DIAGNOSIS — R911 Solitary pulmonary nodule: Secondary | ICD-10-CM | POA: Insufficient documentation

## 2022-07-19 ENCOUNTER — Ambulatory Visit: Payer: Self-pay

## 2022-07-19 ENCOUNTER — Ambulatory Visit
Admission: EM | Admit: 2022-07-19 | Discharge: 2022-07-19 | Disposition: A | Discharge status: Home / Self Care | Payer: Self-pay | Attending: Family Medicine | Admitting: Family Medicine

## 2022-07-19 DIAGNOSIS — R062 Wheezing: Secondary | ICD-10-CM

## 2022-07-19 DIAGNOSIS — Z7952 Long term (current) use of systemic steroids: Secondary | ICD-10-CM | POA: Insufficient documentation

## 2022-07-19 DIAGNOSIS — Z1152 Encounter for screening for COVID-19: Secondary | ICD-10-CM | POA: Insufficient documentation

## 2022-07-19 DIAGNOSIS — F17210 Nicotine dependence, cigarettes, uncomplicated: Secondary | ICD-10-CM

## 2022-07-19 DIAGNOSIS — Z87898 Personal history of other specified conditions: Secondary | ICD-10-CM

## 2022-07-19 DIAGNOSIS — R6883 Chills (without fever): Secondary | ICD-10-CM

## 2022-07-19 DIAGNOSIS — Z79899 Other long term (current) drug therapy: Secondary | ICD-10-CM | POA: Insufficient documentation

## 2022-07-19 DIAGNOSIS — J069 Acute upper respiratory infection, unspecified: Secondary | ICD-10-CM

## 2022-07-19 DIAGNOSIS — R0602 Shortness of breath: Secondary | ICD-10-CM

## 2022-07-19 LAB — RESP PANEL BY RT-PCR (FLU A&B, COVID) ARPGX2
Influenza A by PCR: NEGATIVE
Influenza B by PCR: NEGATIVE
SARS Coronavirus 2 by RT PCR: NEGATIVE

## 2022-07-19 MED ORDER — PREDNISONE 20 MG PO TABS: 40.0000 mg | ORAL_TABLET | Freq: Every day | ORAL | 0 refills | Status: DC

## 2022-07-19 MED ORDER — PROMETHAZINE-DM 6.25-15 MG/5ML PO SYRP: 5.0000 mL | ORAL_SOLUTION | Freq: Four times a day (QID) | ORAL | 0 refills | Status: DC | PRN

## 2022-07-19 MED ORDER — ALBUTEROL SULFATE HFA 108 (90 BASE) MCG/ACT IN AERS: 2.0000 | INHALATION_SPRAY | RESPIRATORY_TRACT | 0 refills | Status: DC | PRN

## 2022-07-19 NOTE — Discharge Instructions (Addendum)
You may take Coricidin HBP, Flonase, antihistamines, plain Mucinex for cold and congestion symptoms without bothering your blood pressure

## 2022-07-19 NOTE — ED Triage Notes (Signed)
Pt reports cough, chest pain when coughing and back pain 3 days. Pt reports he was told 2 weeks ago as he has to stop smoking as he has  "something" in his lungs.

## 2022-07-19 NOTE — ED Provider Notes (Signed)
[facility]    CSN: [id number] Arrival date & time: 07/19/22  1226      History   Chief Complaint Chief Complaint  Patient presents with   Cough   Chest Pain   Back Pain         HPI Mike Keller is a 56 y.o. male.   Patient presenting today with 3-day history of nasal congestion, sore throat, hacking cough, chest tightness, wheezing.  Denies fever, chills, chest pain, abdominal pain, nausea vomiting or diarrhea.  So far not trying anything over-the-counter as he did not know what he can take with his high blood pressure.  Has a history of asthma not currently on any inhalers, chronic cigarette smoker and has known pulmonary nodules being followed closely by PCP through screening CTs.    Past Medical History:  Diagnosis Date   Allergy    Asthma    as a child   Chronic back pain    Hypertension     Patient Active Problem List   Diagnosis Date Noted   Pulmonary nodule 03/16/2021   Tobacco abuse 04/20/2017   Angioedema due to angiotensin converting enzyme inhibitor (ACE-I)    Chronic back pain    Hypertension     Past Surgical History:  Procedure Laterality Date   HAND SURGERY     left   HAND SURGERY     HAND SURGERY Left        Home Medications    Prior to Admission medications   Medication Sig Start Date End Date Taking? Authorizing Provider  albuterol (VENTOLIN HFA) 108 (90 Base) MCG/ACT inhaler Inhale 2 puffs into the lungs every 4 (four) hours as needed for wheezing or shortness of breath. 07/19/22  Yes Particia Nearing, PA-C  predniSONE (DELTASONE) 20 MG tablet Take 2 tablets (40 mg total) by mouth daily with breakfast. 07/19/22  Yes Particia Nearing, PA-C  promethazine-dextromethorphan (PROMETHAZINE-DM) 6.25-15 MG/5ML syrup Take 5 mLs by mouth 4 (four) times daily as needed. 07/19/22  Yes Particia Nearing, PA-C  amLODipine (NORVASC) 5 MG tablet Take 1 tablet (5 mg total) by mouth daily. 07/06/22   Jacquelin Hawking, PA-C     Family History Family History  Problem Relation Age of Onset   Asthma Mother    Hypertension Mother    Hypertension Father    Kidney disease Brother    Stroke Brother    Hypertension Brother     Social History Social History   Tobacco Use   Smoking status: Every Day    Packs/day: 1.00    Types: Cigarettes    Start date: 09/06/1993   Smokeless tobacco: Never  Vaping Use   Vaping Use: Never used  Substance Use Topics   Alcohol use: Yes    Alcohol/week: 7.0 standard drinks of alcohol    Types: 7 Cans of beer per week    Comment: beer, 1 cans daily   Drug use: Not Currently    Types: Marijuana    Comment: none since may 2022.     Allergies   Lisinopril   Review of Systems Review of Systems HPI  Physical Exam Triage Vital Signs ED Triage Vitals  Enc Vitals Group     BP 07/19/22 1249 (!) 144/90     Pulse Rate 07/19/22 1249 89     Resp 07/19/22 1249 16     Temp 07/19/22 1249 98.6 F (37 C)     Temp Source 07/19/22 1249 Oral     SpO2  07/19/22 1249 97 %     Weight --      Height --      Head Circumference --      Peak Flow --      Pain Score 07/19/22 1304 7     Pain Loc --      Pain Edu? --      Excl. in GC? --    No data found.  Updated Vital Signs BP (!) 144/90 (BP Location: Right Arm)   Pulse 89   Temp 98.6 F (37 C) (Oral)   Resp 16   SpO2 97%   Visual Acuity Right Eye Distance:   Left Eye Distance:   Bilateral Distance:    Right Eye Near:   Left Eye Near:    Bilateral Near:     Physical Exam Vitals and nursing note reviewed.  Constitutional:      Appearance: He is well-developed.  HENT:     Head: Atraumatic.     Right Ear: External ear normal.     Left Ear: External ear normal.     Nose: Rhinorrhea present.     Mouth/Throat:     Mouth: Mucous membranes are moist.     Pharynx: Posterior oropharyngeal erythema present. No oropharyngeal exudate.  Eyes:     Conjunctiva/sclera: Conjunctivae normal.     Pupils: Pupils are equal,  round, and reactive to light.  Cardiovascular:     Rate and Rhythm: Normal rate and regular rhythm.  Pulmonary:     Effort: Pulmonary effort is normal. No respiratory distress.     Breath sounds: Wheezing present. No rales.     Comments: Decreased breath sounds throughout Musculoskeletal:        General: Normal range of motion.     Cervical back: Normal range of motion and neck supple.  Lymphadenopathy:     Cervical: No cervical adenopathy.  Skin:    General: Skin is warm and dry.  Neurological:     Mental Status: He is alert and oriented to person, place, and time.  Psychiatric:        Behavior: Behavior normal.      UC Treatments / Results  Labs (all labs ordered are listed, but only abnormal results are displayed) Labs Reviewed  RESP PANEL BY RT-PCR (FLU A&B, COVID) [id number]    EKG   Radiology No results found.  Procedures Procedures (including critical care time)  Medications Ordered in UC Medications - No data to display  Initial Impression / Assessment and Plan / UC Course  I have reviewed the triage vital signs and the nursing notes.  Pertinent labs & imaging results that were available during my care of the patient were reviewed by me and considered in my medical decision making (see chart for details).     Minimally hypertensive in triage, otherwise vital signs reassuring today.  Suspect viral upper respiratory infection causing an exacerbation of asthma/possible COPD.  He is history is complicated by pulmonary nodules and chronic cigarette smoking.  Will test for COVID and flu, good candidate for antiviral therapy if positive for either.  We will also treat with a course of prednisone, albuterol, Phenergan DM and list given of safe cold and congestion medications over-the-counter that will not affect his high blood pressure.  Return for any worsening symptoms.  Work note given.  Final Clinical Impressions(s) / UC Diagnoses   Final diagnoses:  Viral URI  with cough  Wheezing  SOB (shortness of breath)  Chills  History  of multiple pulmonary nodules  Cigarette smoker     Discharge Instructions      You may take Coricidin HBP, Flonase, antihistamines, plain Mucinex for cold and congestion symptoms without bothering your blood pressure    ED Prescriptions     Medication Sig Dispense Auth. Provider   predniSONE (DELTASONE) 20 MG tablet Take 2 tablets (40 mg total) by mouth daily with breakfast. 10 tablet Particia Nearing, PA-C   albuterol (VENTOLIN HFA) 108 (90 Base) MCG/ACT inhaler Inhale 2 puffs into the lungs every 4 (four) hours as needed for wheezing or shortness of breath. 18 g Roosvelt Maser Jennings, [place]   promethazine-dextromethorphan (PROMETHAZINE-DM) 6.25-15 MG/5ML syrup Take 5 mLs by mouth 4 (four) times daily as needed. 100 mL Particia Nearing, [place]      PDMP not reviewed this encounter.   Particia Nearing, [place] 07/19/22 1427

## 2022-08-23 ENCOUNTER — Other Ambulatory Visit: Payer: Self-pay | Admitting: Physician Assistant

## 2022-09-14 ENCOUNTER — Other Ambulatory Visit: Payer: Self-pay | Admitting: Physician Assistant

## 2022-09-14 DIAGNOSIS — I10 Essential (primary) hypertension: Secondary | ICD-10-CM

## 2022-09-14 DIAGNOSIS — Z125 Encounter for screening for malignant neoplasm of prostate: Secondary | ICD-10-CM

## 2022-09-14 DIAGNOSIS — D649 Anemia, unspecified: Secondary | ICD-10-CM

## 2022-10-06 ENCOUNTER — Ambulatory Visit: Payer: Self-pay | Admitting: Physician Assistant

## 2022-10-06 ENCOUNTER — Encounter: Payer: Self-pay | Admitting: Physician Assistant

## 2022-10-06 VITALS — BP 150/90 | HR 96 | Temp 96.5°F | Wt 128.0 lb

## 2022-10-06 DIAGNOSIS — F17200 Nicotine dependence, unspecified, uncomplicated: Secondary | ICD-10-CM

## 2022-10-06 DIAGNOSIS — R062 Wheezing: Secondary | ICD-10-CM

## 2022-10-06 DIAGNOSIS — I10 Essential (primary) hypertension: Secondary | ICD-10-CM

## 2022-10-06 DIAGNOSIS — J069 Acute upper respiratory infection, unspecified: Secondary | ICD-10-CM

## 2022-10-06 MED ORDER — ALBUTEROL SULFATE HFA 108 (90 BASE) MCG/ACT IN AERS: 2.0000 | INHALATION_SPRAY | RESPIRATORY_TRACT | 0 refills | Status: DC | PRN

## 2022-10-06 MED ORDER — AMLODIPINE BESYLATE 10 MG PO TABS: 10.0000 mg | ORAL_TABLET | Freq: Every day | ORAL | 1 refills | Status: DC

## 2022-10-06 NOTE — Progress Notes (Signed)
   BP (!) 150/90   Pulse 96   Temp (!) 96.5 F (35.8 C)   Wt 128 lb (58.1 kg)   SpO2 98%   BMI 20.05 kg/m    Subjective:    Patient ID: Mike Keller, male    DOB: 02/09/1966, 57 y.o.   MRN: [id number]  HPI: Mike Keller is a 57 y.o. male presenting on 10/06/2022 for Hypertension   HPI   Chief Complaint  Patient presents with   Hypertension    Pt is Still working at [employer]  He says he was sick over the weekend and is mostly better but still has some cough.  He continues to smoke but says he has cut back to 5/day.    Relevant past medical, surgical, family and social history reviewed and updated as indicated. Interim medical history since our last visit reviewed. Allergies and medications reviewed and updated.   Current Outpatient Medications:    amLODipine (NORVASC) 5 MG tablet, Take 1 tablet by mouth once daily, Disp: 30 tablet, Rfl: 1   Review of Systems  Per HPI unless specifically indicated above     Objective:    BP (!) 150/90   Pulse 96   Temp (!) 96.5 F (35.8 C)   Wt 128 lb (58.1 kg)   SpO2 98%   BMI 20.05 kg/m   Wt Readings from Last 3 Encounters:  10/06/22 128 lb (58.1 kg)  07/06/22 130 lb (59 kg)  05/21/22 132 lb (59.9 kg)    Physical Exam Vitals reviewed.  Constitutional:      General: He is not in acute distress.    Appearance: He is well-developed. He is not ill-appearing.  HENT:     Head: Normocephalic and atraumatic.  Cardiovascular:     Rate and Rhythm: Normal rate and regular rhythm.  Pulmonary:     Effort: Pulmonary effort is normal.     Breath sounds: No stridor. Wheezing present. No rhonchi.     Comments: Scattered expiratory wheezes Abdominal:     General: Bowel sounds are normal.     Palpations: Abdomen is soft.     Tenderness: There is no abdominal tenderness.  Musculoskeletal:     Cervical back: Neck supple.     Right lower leg: No edema.     Left lower leg: No edema.  Lymphadenopathy:     Cervical: No cervical  adenopathy.  Skin:    General: Skin is warm and dry.  Neurological:     Mental Status: He is alert and oriented to person, place, and time.  Psychiatric:        Behavior: Behavior normal.            Assessment & Plan:    Encounter Diagnoses  Name Primary?   Primary hypertension Yes   Tobacco use disorder    Wheezes    Upper respiratory tract infection, unspecified type      HTN -increase amlodipine.  Follow up 1 month to recheck  URI -pt was given sample proair to use for wheezes.    Smoking -encouraged cessation  -on way out door, pt says he has problems with standing for 12 hour shift and wants referral.  Agreed to evaluate at his follow up appointment in one month. -pt to contact office if needed sooner than 1 month

## 2022-11-01 ENCOUNTER — Other Ambulatory Visit: Payer: Self-pay | Admitting: Physician Assistant

## 2022-11-01 MED ORDER — AMLODIPINE BESYLATE 10 MG PO TABS: 10.0000 mg | ORAL_TABLET | Freq: Every day | ORAL | 1 refills | Status: DC

## 2022-11-02 ENCOUNTER — Other Ambulatory Visit: Payer: Self-pay | Admitting: Physician Assistant

## 2022-11-04 ENCOUNTER — Other Ambulatory Visit (HOSPITAL_COMMUNITY)
Admission: RE | Admit: 2022-11-04 | Discharge: 2022-11-04 | Disposition: A | Discharge status: Home / Self Care | Payer: Self-pay | Source: Ambulatory Visit | Attending: Physician Assistant | Admitting: Physician Assistant

## 2022-11-04 ENCOUNTER — Ambulatory Visit: Payer: Self-pay | Admitting: Physician Assistant

## 2022-11-04 DIAGNOSIS — Z125 Encounter for screening for malignant neoplasm of prostate: Secondary | ICD-10-CM | POA: Insufficient documentation

## 2022-11-04 DIAGNOSIS — I10 Essential (primary) hypertension: Secondary | ICD-10-CM | POA: Insufficient documentation

## 2022-11-04 DIAGNOSIS — D649 Anemia, unspecified: Secondary | ICD-10-CM | POA: Insufficient documentation

## 2022-11-04 LAB — BASIC METABOLIC PANEL
Anion gap: 10 (ref 5–15)
BUN: 8 mg/dL (ref 6–20)
CO2: 23 mmol/L (ref 22–32)
Calcium: 8.8 mg/dL — ABNORMAL LOW (ref 8.9–10.3)
Chloride: 104 mmol/L (ref 98–111)
Creatinine, Ser: 0.72 mg/dL (ref 0.61–1.24)
GFR, Estimated: >60 mL/min (ref >60)
Glucose, Bld: 88 mg/dL (ref 70–99)
Potassium: 4.4 mmol/L (ref 3.5–5.1)
Sodium: 137 mmol/L (ref 135–145)

## 2022-11-04 LAB — HEMOGLOBIN AND HEMATOCRIT, BLOOD
HCT: 41.7 % (ref 39.0–52.0)
Hemoglobin: 14.3 g/dL (ref 13.0–17.0)

## 2022-11-04 LAB — PSA: Prostatic Specific Antigen: 2.6 ng/mL (ref 0.00–4.00)

## 2022-11-09 ENCOUNTER — Ambulatory Visit: Payer: Self-pay | Admitting: Physician Assistant

## 2022-11-09 ENCOUNTER — Encounter: Payer: Self-pay | Admitting: Physician Assistant

## 2022-11-09 VITALS — BP 126/85 | HR 104 | Temp 97.6°F | Wt 129.5 lb

## 2022-11-09 DIAGNOSIS — M25551 Pain in right hip: Secondary | ICD-10-CM

## 2022-11-09 DIAGNOSIS — I10 Essential (primary) hypertension: Secondary | ICD-10-CM

## 2022-11-09 DIAGNOSIS — F17200 Nicotine dependence, unspecified, uncomplicated: Secondary | ICD-10-CM

## 2022-11-09 MED ORDER — PREDNISONE 10 MG PO TABS: ORAL_TABLET | ORAL | 0 refills | Status: DC

## 2022-11-09 NOTE — Progress Notes (Signed)
BP 126/85   Pulse (!) 104   Temp 97.6 F (36.4 C)   Wt 129 lb 8 oz (58.7 kg)   SpO2 99%   BMI 20.28 kg/m    Subjective:    Patient ID: Mike Keller, male    DOB: 1966-07-29, 57 y.o.   MRN: [id number]  HPI: Mike Keller is a 57 y.o. male presenting on 11/09/2022 for Hypertension (Pt states with increased dose of amlodipine he has felt tired. Pt denies this happening when on lower dose.), Hip Pain (R hip pain. Pt states he woke up on Friday morning and it was hurt when he stood up. Pt states it hurts when he lies on his left side and hurts when he stands, and walks. Pt denies pain when sitting. Pt has been taking IBU which eases the pain. Pt denies injury.), and Leg Pain (Pt reports upper bilateral leg pain on hour 10 of his 12 hour shift. Pt denies swelling. Pt states he has pain at times when he is at home on a day off. Pt states his legs sometimes give out. Pt states he feels tired "all the time and want to sleep")   HPI  Chief Complaint  Patient presents with   Hypertension    Pt states with increased dose of amlodipine he has felt tired. Pt denies this happening when on lower dose.   Hip Pain    R hip pain. Pt states he woke up on Friday morning and it was hurt when he stood up. Pt states it hurts when he lies on his left side and hurts when he stands, and walks. Pt denies pain when sitting. Pt has been taking IBU which eases the pain. Pt denies injury.   Leg Pain    Pt reports upper bilateral leg pain on hour 10 of his 12 hour shift. Pt denies swelling. Pt states he has pain at times when he is at home on a day off. Pt states his legs sometimes give out. Pt states he feels tired "all the time and want to sleep"     Pt says his is fine with continuing the '10mg'$  amlodipine.  He says he guesses he just needed to get used to it.  He says he feels fine now from taking it.  Pt says right now his hip is hurting.  He says he thinks that if he went to work now, he would start having pain  in both thights.  Pt denies recent injury.   Pt states bowel & bladder normal.  He has no numbness or tingling, just pain.    Relevant past medical, surgical, family and social history reviewed and updated as indicated. Interim medical history since our last visit reviewed. Allergies and medications reviewed and updated.  CURRENT MEDS: Albuterol MDI Amlodipine '10mg'$    Review of Systems  Per HPI unless specifically indicated above     Objective:    BP 126/85   Pulse (!) 104   Temp 97.6 F (36.4 C)   Wt 129 lb 8 oz (58.7 kg)   SpO2 99%   BMI 20.28 kg/m   Wt Readings from Last 3 Encounters:  11/09/22 129 lb 8 oz (58.7 kg)  10/06/22 128 lb (58.1 kg)  07/06/22 130 lb (59 kg)    Physical Exam Constitutional:      General: He is not in acute distress.    Appearance: He is not toxic-appearing.  HENT:     Head: Normocephalic and atraumatic.  Abdominal:     Palpations: Abdomen is soft. There is no mass.     Tenderness: There is no abdominal tenderness.  Musculoskeletal:     Lumbar back: Tenderness present. No bony tenderness. Positive right straight leg raise test. Negative left straight leg raise test.     Right hip: Decreased range of motion.     Left hip: Normal range of motion.     Right upper leg: No swelling or tenderness.     Left upper leg: No swelling or tenderness.     Right knee: No swelling. No tenderness.     Left knee: No swelling. No tenderness.     Right lower leg: No swelling or tenderness. No edema.     Left lower leg: No swelling or tenderness. No edema.     Right ankle: No swelling. No tenderness. Normal pulse.     Left ankle: No swelling. No tenderness. Normal pulse.     Comments: Rotation testing hips- left normal, right with significant pain on any ROM.  No instability  Neurological:     Mental Status: He is alert and oriented to person, place, and time.  Psychiatric:        Attention and Perception: Attention normal.        Speech: Speech  normal.        Behavior: Behavior normal. Behavior is cooperative.     Results for orders placed or performed during the hospital encounter of [id number]  Basic metabolic panel  Result Value Ref Range   Sodium 137 135 - 145 mmol/L   Potassium 4.4 3.5 - 5.1 mmol/L   Chloride 104 98 - 111 mmol/L   CO2 23 22 - 32 mmol/L   Glucose, Bld 88 70 - 99 mg/dL   BUN 8 6 - 20 mg/dL   Creatinine, Ser 0.72 0.61 - 1.24 mg/dL   Calcium 8.8 (L) 8.9 - 10.3 mg/dL   GFR, Estimated >60 >60 mL/min   Anion gap 10 5 - 15  Hemoglobin and hematocrit, blood  Result Value Ref Range   Hemoglobin 14.3 13.0 - 17.0 g/dL   HCT 41.7 39.0 - 52.0 %  PSA  Result Value Ref Range   Prostatic Specific Antigen 2.60 0.00 - 4.00 ng/mL      Assessment & Plan:   Encounter Diagnoses  Name Primary?   Primary hypertension Yes   Tobacco use disorder    Right hip pain      -reviewed labs with pt -pt to continue amlodipine '10mg'$  -Prednisone taper -pt was given note to RTW Monday 11/15/22 -f/u 3 months.  He is to RTO sooner if his hip pain fails to resolve or if he develops new symptoms.

## 2022-11-11 ENCOUNTER — Ambulatory Visit: Payer: Self-pay | Admitting: Physician Assistant

## 2023-02-09 ENCOUNTER — Other Ambulatory Visit: Payer: Self-pay | Admitting: Physician Assistant

## 2023-02-09 ENCOUNTER — Encounter: Payer: Self-pay | Admitting: Physician Assistant

## 2023-02-09 ENCOUNTER — Ambulatory Visit: Payer: Self-pay | Admitting: Physician Assistant

## 2023-02-09 VITALS — BP 134/80 | HR 79 | Temp 98.0°F | Wt 133.0 lb

## 2023-02-09 DIAGNOSIS — F17200 Nicotine dependence, unspecified, uncomplicated: Secondary | ICD-10-CM

## 2023-02-09 DIAGNOSIS — Z23 Encounter for immunization: Secondary | ICD-10-CM

## 2023-02-09 DIAGNOSIS — Z1211 Encounter for screening for malignant neoplasm of colon: Secondary | ICD-10-CM

## 2023-02-09 DIAGNOSIS — I10 Essential (primary) hypertension: Secondary | ICD-10-CM

## 2023-02-09 MED ORDER — AMLODIPINE BESYLATE 10 MG PO TABS: 10.0000 mg | ORAL_TABLET | Freq: Every day | ORAL | 1 refills | Status: DC

## 2023-02-09 MED ORDER — AMLODIPINE BESYLATE 10 MG PO TABS: 10.0000 mg | ORAL_TABLET | Freq: Every day | ORAL | 3 refills | Status: DC

## 2023-02-09 MED ORDER — ALBUTEROL SULFATE HFA 108 (90 BASE) MCG/ACT IN AERS: 2.0000 | INHALATION_SPRAY | RESPIRATORY_TRACT | 0 refills | Status: DC | PRN

## 2023-02-09 NOTE — Progress Notes (Signed)
BP 134/80   Pulse 79   Temp 98 F (36.7 C)   Wt 133 lb (60.3 kg)   SpO2 99%   BMI 20.83 kg/m    Subjective:    Patient ID: Mike Keller, male    DOB: 1965/12/25, 57 y.o.   MRN: [id number]  HPI: Mike Keller is a 57 y.o. male presenting on 02/09/2023 for Hypertension   HPI   Chief Complaint  Patient presents with   Hypertension    Pt says he is doing well.  He is still working at [employer].     Relevant past medical, surgical, family and social history reviewed and updated as indicated. Interim medical history since our last visit reviewed. Allergies and medications reviewed and updated.   Current Outpatient Medications:    amLODipine (NORVASC) 10 MG tablet, Take 1 tablet (10 mg total) by mouth daily., Disp: 30 tablet, Rfl: 1   albuterol (VENTOLIN HFA) 108 (90 Base) MCG/ACT inhaler, Inhale 2 puffs into the lungs every 4 (four) hours as needed for wheezing or shortness of breath. (Patient not taking: Reported on 02/09/2023), Disp: 18 g, Rfl: 0    Review of Systems  Per HPI unless specifically indicated above     Objective:    BP 134/80   Pulse 79   Temp 98 F (36.7 C)   Wt 133 lb (60.3 kg)   SpO2 99%   BMI 20.83 kg/m   Wt Readings from Last 3 Encounters:  02/09/23 133 lb (60.3 kg)  11/09/22 129 lb 8 oz (58.7 kg)  10/06/22 128 lb (58.1 kg)    Physical Exam Vitals reviewed.  Constitutional:      General: He is not in acute distress.    Appearance: He is well-developed. He is not toxic-appearing.  HENT:     Head: Normocephalic and atraumatic.  Cardiovascular:     Rate and Rhythm: Normal rate and regular rhythm.  Pulmonary:     Effort: Pulmonary effort is normal.     Breath sounds: Normal breath sounds. No stridor. No wheezing or rhonchi.  Abdominal:     General: Bowel sounds are normal.     Palpations: Abdomen is soft. There is no hepatomegaly, splenomegaly or pulsatile mass.     Tenderness: There is no abdominal tenderness.     Hernia: A hernia is  present. Hernia is present in the ventral area.  Musculoskeletal:     Cervical back: Neck supple.     Right lower leg: No edema.     Left lower leg: No edema.  Lymphadenopathy:     Cervical: No cervical adenopathy.  Skin:    General: Skin is warm and dry.  Neurological:     Mental Status: He is alert and oriented to person, place, and time.  Psychiatric:        Behavior: Behavior normal.           Assessment & Plan:    Encounter Diagnoses  Name Primary?   Primary hypertension Yes   Tobacco use disorder    Screen for colon cancer    Need for COVID-19 vaccine      -pt to Continue amlodipine for HTN -pt given sample Inhaler and Rx sent to [facility] -CT due November to follow up pulmonary nodule in this pt who smokes 1ppd -FIT test given for colon cancer screening -encouraged Smoking cessation -pt completed screening questions, received covid vaccination and was observed for 15 minutes.  He was given VIS -pt will follow up  in 4 months.  He is to contact office sooner prn

## 2023-02-24 LAB — POC FIT TEST STOOL: Fecal Occult Blood: NEGATIVE

## 2023-05-23 ENCOUNTER — Other Ambulatory Visit: Payer: Self-pay | Admitting: Physician Assistant

## 2023-05-23 DIAGNOSIS — I10 Essential (primary) hypertension: Secondary | ICD-10-CM

## 2023-06-15 ENCOUNTER — Ambulatory Visit: Payer: Self-pay | Admitting: Physician Assistant

## 2023-06-15 ENCOUNTER — Encounter: Payer: Self-pay | Admitting: Physician Assistant

## 2023-06-15 VITALS — BP 140/82 | HR 94 | Temp 97.9°F | Wt 125.0 lb

## 2023-06-15 DIAGNOSIS — I10 Essential (primary) hypertension: Secondary | ICD-10-CM

## 2023-06-15 DIAGNOSIS — R911 Solitary pulmonary nodule: Secondary | ICD-10-CM

## 2023-06-15 DIAGNOSIS — F17200 Nicotine dependence, unspecified, uncomplicated: Secondary | ICD-10-CM

## 2023-06-15 NOTE — Progress Notes (Signed)
   BP (!) 140/82   Pulse 94   Temp 97.9 F (36.6 C)   Wt 125 lb (56.7 kg)   SpO2 100%   BMI 19.58 kg/m    Subjective:    Patient ID: Mike Keller, male    DOB: [birth year], 57 y.o.   MRN: [id number]  HPI: Mike Keller is a 57 y.o. male presenting on 06/15/2023 for Hypertension   HPI  Chief Complaint  Patient presents with   Hypertension    Pt says he is doing well.  He is still working 3rd shift at [employer]..  he is still smoking but has cut back from previous 1 ppd.   Relevant past medical, surgical, family and social history reviewed and updated as indicated. Interim medical history since our last visit reviewed. Allergies and medications reviewed and updated.   Current Outpatient Medications:    albuterol (VENTOLIN HFA) 108 (90 Base) MCG/ACT inhaler, Inhale 2 puffs into the lungs every 4 (four) hours as needed for wheezing or shortness of breath., Disp: 3 each, Rfl: 0   amLODipine (NORVASC) 10 MG tablet, Take 1 tablet (10 mg total) by mouth daily., Disp: 90 tablet, Rfl: 1     Review of Systems  Per HPI unless specifically indicated above     Objective:    BP (!) 140/82   Pulse 94   Temp 97.9 F (36.6 C)   Wt 125 lb (56.7 kg)   SpO2 100%   BMI 19.58 kg/m   Wt Readings from Last 3 Encounters:  06/15/23 125 lb (56.7 kg)  02/09/23 133 lb (60.3 kg)  11/09/22 129 lb 8 oz (58.7 kg)    Physical Exam Vitals reviewed.  Constitutional:      General: He is not in acute distress.    Appearance: He is well-developed. He is not toxic-appearing.  HENT:     Head: Normocephalic and atraumatic.  Cardiovascular:     Rate and Rhythm: Normal rate and regular rhythm.  Pulmonary:     Effort: Pulmonary effort is normal.     Breath sounds: Normal breath sounds. No wheezing.  Abdominal:     General: Bowel sounds are normal.     Palpations: Abdomen is soft.     Tenderness: There is no abdominal tenderness.  Musculoskeletal:     Cervical back: Neck supple.     Right lower  leg: No edema.     Left lower leg: No edema.  Lymphadenopathy:     Cervical: No cervical adenopathy.  Skin:    General: Skin is warm and dry.  Neurological:     Mental Status: He is alert and oriented to person, place, and time.  Psychiatric:        Behavior: Behavior normal.          Assessment & Plan:   Encounter Diagnoses  Name Primary?   Primary hypertension Yes   Tobacco use disorder    Pulmonary nodule     -pt to Get labs/bmp drawn -will order ct Due November f/u  nodule  (Early in morning is best) -pt is given cafa application -pt counseled on limiting sodium and given DASH handout -encouraged smoking cessation -pt will follow up 1 month to recheck bp.  He is to contact office sooner prn

## 2023-06-15 NOTE — Patient Instructions (Signed)

## 2023-06-16 ENCOUNTER — Other Ambulatory Visit (HOSPITAL_COMMUNITY)
Admission: RE | Admit: 2023-06-16 | Discharge: 2023-06-16 | Disposition: A | Discharge status: Home / Self Care | Payer: 59 | Source: Ambulatory Visit | Attending: Physician Assistant | Admitting: Physician Assistant

## 2023-06-16 DIAGNOSIS — I10 Essential (primary) hypertension: Secondary | ICD-10-CM | POA: Insufficient documentation

## 2023-06-16 LAB — BASIC METABOLIC PANEL
Anion gap: 8 (ref 5–15)
BUN: 6 mg/dL (ref 6–20)
CO2: 26 mmol/L (ref 22–32)
Calcium: 8.4 mg/dL — ABNORMAL LOW (ref 8.9–10.3)
Chloride: 98 mmol/L (ref 98–111)
Creatinine, Ser: 0.73 mg/dL (ref 0.61–1.24)
GFR, Estimated: >60 mL/min (ref >60)
Glucose, Bld: 120 mg/dL — ABNORMAL HIGH (ref 70–99)
Potassium: 3.9 mmol/L (ref 3.5–5.1)
Sodium: 132 mmol/L — ABNORMAL LOW (ref 135–145)

## 2023-06-28 ENCOUNTER — Ambulatory Visit (HOSPITAL_COMMUNITY): Payer: 59

## 2023-07-19 ENCOUNTER — Ambulatory Visit: Payer: Self-pay | Admitting: Physician Assistant

## 2023-08-18 ENCOUNTER — Other Ambulatory Visit: Payer: Self-pay

## 2023-08-18 ENCOUNTER — Encounter: Payer: Self-pay | Admitting: Physician Assistant

## 2023-08-18 ENCOUNTER — Ambulatory Visit: Payer: 59 | Admitting: Physician Assistant

## 2023-08-18 VITALS — BP 153/94 | HR 88 | Ht 67.0 in | Wt 127.0 lb

## 2023-08-18 DIAGNOSIS — I10 Essential (primary) hypertension: Secondary | ICD-10-CM

## 2023-08-18 DIAGNOSIS — Z720 Tobacco use: Secondary | ICD-10-CM

## 2023-08-18 DIAGNOSIS — Z122 Encounter for screening for malignant neoplasm of respiratory organs: Secondary | ICD-10-CM

## 2023-08-18 MED ORDER — AMLODIPINE BESYLATE 10 MG PO TABS
10.0000 mg | ORAL_TABLET | Freq: Every day | ORAL | 1 refills | Status: DC
Start: 2023-08-18 — End: 2023-08-23

## 2023-08-18 NOTE — Progress Notes (Signed)
New Patient Office Visit  Subjective    Patient ID: Mike Keller, male    DOB: 1966/03/25  Age: 57 y.o. MRN: [id number]  CC:  Chief Complaint  Patient presents with   Establish Care    HPI Mike Keller states that he is no longer able to be seen by his primary care provider due to a change in insurance.  States that he was [facility].    States that he does check his blood pressure at home, states that it is generally "pretty good" but is unable to state what the readings are.  States that he is compliant to his medication.  States that he is working on stopping smoking, is only smoking 6 to 7 cigarettes a day.    IMPRESSION: 1. A 3 mm subpleural nodule in the left upper lobe is unchanged. 2. Two nearby nodules in the left lower lobe that measure 3 mm each and may be endobronchial. No follow-up needed if patient is low-risk (and has no known or suspected primary neoplasm). Non-contrast chest CT can be considered in 12 months if patient is high-risk. This recommendation follows the consensus statement: Guidelines for Management of Incidental Pulmonary Nodules Detected on CT Images: From the Fleischner Society 2017; Radiology 2017; 284:228-243. 3. No acute abnormality of the chest.     Electronically Signed   By: Deatra Robinson M.D.   On: 07/10/2022 13:40       Outpatient Encounter Medications as of 08/18/2023  Medication Sig   albuterol (VENTOLIN HFA) 108 (90 Base) MCG/ACT inhaler Inhale 2 puffs into the lungs every 4 (four) hours as needed for wheezing or shortness of breath.   amLODipine (NORVASC) 10 MG tablet Take 1 tablet (10 mg total) by mouth daily.   [DISCONTINUED] amLODipine (NORVASC) 10 MG tablet Take 1 tablet (10 mg total) by mouth daily.   No facility-administered encounter medications on file as of 08/18/2023.    Past Medical History:  Diagnosis Date   Allergy    Asthma    as a child   Chronic back pain    Hypertension      Past Surgical History:  Procedure Laterality Date   HAND SURGERY     left   HAND SURGERY     HAND SURGERY Left     Family History  Problem Relation Age of Onset   Asthma Mother    Hypertension Mother    Hypertension Father    Kidney disease Brother    Stroke Brother    Hypertension Brother     Social History   Socioeconomic History   Marital status: Single    Spouse name: Not on file   Number of children: 0   Years of education: 11   Highest education level: Not on file  Occupational History   Occupation: Administrator, [employer], forklift  Tobacco Use   Smoking status: Every Day    Current packs/day: 1.00    Average packs/day: 1 pack/day for 29.9 years (29.9 ttl pk-yrs)    Types: Cigarettes    Start date: 09/06/1993   Smokeless tobacco: Never  Vaping Use   Vaping status: Never Used  Substance and Sexual Activity   Alcohol use: Yes    Alcohol/week: 7.0 standard drinks of alcohol    Types: 7 Cans of beer per week    Comment: beer, 1 cans daily   Drug use: Not Currently    Types: Marijuana    Comment: none since may  2022.   Sexual activity: Yes    Birth control/protection: None  Other Topics Concern   Not on file  Social History Narrative   Lives with girlfriend   Social Drivers of Corporate investment banker Strain: Not on file  Food Insecurity: Not on file  Transportation Needs: Not on file  Physical Activity: Not on file  Stress: Not on file  Social Connections: Not on file  Intimate Partner Violence: Not on file    Review of Systems  Constitutional: Negative.   HENT: Negative.    Eyes: Negative.   Respiratory:  Negative for shortness of breath.   Cardiovascular:  Negative for chest pain.  Gastrointestinal: Negative.   Genitourinary: Negative.   Musculoskeletal: Negative.   Skin: Negative.   Neurological: Negative.   Endo/Heme/Allergies: Negative.   Psychiatric/Behavioral: Negative.          Objective    BP (!) 153/94 (BP Location: Left  Arm, Patient Position: Sitting, Cuff Size: Large)   Pulse 88   Ht 5\' 7"  (1.702 m)   Wt 127 lb (57.6 kg)   SpO2 96%   BMI 19.89 kg/m   Physical Exam Vitals and nursing note reviewed.  Constitutional:      Appearance: Normal appearance.  HENT:     Head: Normocephalic and atraumatic.     Right Ear: External ear normal.     Left Ear: External ear normal.     Nose: Nose normal.     Mouth/Throat:     Mouth: Mucous membranes are moist.     Pharynx: Oropharynx is clear.  Eyes:     Extraocular Movements: Extraocular movements intact.     Conjunctiva/sclera: Conjunctivae normal.     Pupils: Pupils are equal, round, and reactive to light.  Cardiovascular:     Rate and Rhythm: Normal rate and regular rhythm.     Pulses: Normal pulses.     Heart sounds: Normal heart sounds.  Pulmonary:     Effort: Pulmonary effort is normal.     Breath sounds: Normal breath sounds.  Musculoskeletal:        General: Normal range of motion.     Cervical back: Normal range of motion and neck supple.  Skin:    General: Skin is warm and dry.  Neurological:     General: No focal deficit present.     Mental Status: He is alert and oriented to person, place, and time.  Psychiatric:        Mood and Affect: Mood normal.        Behavior: Behavior normal.        Thought Content: Thought content normal.        Judgment: Judgment normal.         Assessment & Plan:   Problem List Items Addressed This Visit       Cardiovascular and Mediastinum   Hypertension - Primary   Relevant Medications   amLODipine (NORVASC) 10 MG tablet     Other   Tobacco abuse   Relevant Orders   CT CHEST LUNG CA SCREEN LOW DOSE W/O CM   Other Visit Diagnoses       Elevated blood pressure reading in office with diagnosis of hypertension       Relevant Medications   amLODipine (NORVASC) 10 MG tablet     Screening for lung cancer       Relevant Orders   CT CHEST LUNG CA SCREEN LOW DOSE W/O CM  1. Primary  hypertension (Primary) Continue current regimen.  Patient is going to reach out to establish to a new primary care provider in his area.  Patient encouraged to check blood pressure at home, keep a written log and have available for all office visits.  Patient encouraged to return to mobile unit as needed.  Red flags given for prompt reevaluation. - amLODipine (NORVASC) 10 MG tablet; Take 1 tablet (10 mg total) by mouth daily.  Dispense: 90 tablet; Refill: 1  2. Elevated blood pressure reading in office with diagnosis of hypertension   3. Tobacco abuse  - CT CHEST LUNG CA SCREEN LOW DOSE W/O CM; Future  4. Screening for lung cancer  - CT CHEST LUNG CA SCREEN LOW DOSE W/O CM; Future    I have reviewed the patient's medical history (PMH, PSH, Social History, Family History, Medications, and allergies) , and have been updated if relevant. I spent 30 minutes reviewing chart and  face to face time with patient.   Return if symptoms worsen or fail to improve.   Mike Knudsen Mayers, PA-C

## 2023-08-18 NOTE — Patient Instructions (Addendum)
I sent a refill of your blood pressure medication to your pharmacy.  Your blood pressure is elevated today.  I do encourage you to check your blood pressure at home, keep a written log and have available for all office visits.  I encourage you to schedule an appointment to establish care with a primary care provider in your area.  We will reach out to you a week before we are in [place] next time in case you need to be seen by [place] again.  We will call you with the results of your lung cancer screening when it is available.  Roney Jaffe, PA-C Physician Assistant [facility] [email or web address]  Health Maintenance, Male Adopting a healthy lifestyle and getting preventive care are important in promoting health and wellness. Ask your health care provider about: The right schedule for you to have regular tests and exams. Things you can do on your own to prevent diseases and keep yourself healthy. What should I know about diet, weight, and exercise? Eat a healthy diet  Eat a diet that includes plenty of vegetables, fruits, low-fat dairy products, and lean protein. Do not eat a lot of foods that are high in solid fats, added sugars, or sodium. Maintain a healthy weight Body mass index (BMI) is a measurement that can be used to identify possible weight problems. It estimates body fat based on height and weight. Your health care provider can help determine your BMI and help you achieve or maintain a healthy weight. Get regular exercise Get regular exercise. This is one of the most important things you can do for your health. Most adults should: Exercise for at least 150 minutes each week. The exercise should increase your heart rate and make you sweat (moderate-intensity exercise). Do strengthening exercises at least twice a week. This is in addition to the moderate-intensity exercise. Spend less time sitting. Even light physical activity  can be beneficial. Watch cholesterol and blood lipids Have your blood tested for lipids and cholesterol at 58 years of age, then have this test every 5 years. You may need to have your cholesterol levels checked more often if: Your lipid or cholesterol levels are high. You are older than 57 years of age. You are at high risk for heart disease. What should I know about cancer screening? Many types of cancers can be detected early and may often be prevented. Depending on your health history and family history, you may need to have cancer screening at various ages. This may include screening for: Colorectal cancer. Prostate cancer. Skin cancer. Lung cancer. What should I know about heart disease, diabetes, and high blood pressure? Blood pressure and heart disease High blood pressure causes heart disease and increases the risk of stroke. This is more likely to develop in people who have high blood pressure readings or are overweight. Talk with your health care provider about your target blood pressure readings. Have your blood pressure checked: Every 3-5 years if you are 74-80 years of age. Every year if you are 46 years old or older. If you are between the ages of 47 and 51 and are a current or former smoker, ask your health care provider if you should have a one-time screening for abdominal aortic aneurysm (AAA). Diabetes Have regular diabetes screenings. This checks your fasting blood sugar level. Have the screening done: Once every three years after age 37 if you are at a normal weight and have a low risk for diabetes. More often and at  a younger age if you are overweight or have a high risk for diabetes. What should I know about preventing infection? Hepatitis B If you have a higher risk for hepatitis B, you should be screened for this virus. Talk with your health care provider to find out if you are at risk for hepatitis B infection. Hepatitis C Blood testing is recommended  for: Everyone born from [birth year] through 1965. Anyone with known risk factors for hepatitis C. Sexually transmitted infections (STIs) You should be screened each year for STIs, including gonorrhea and chlamydia, if: You are sexually active and are younger than 57 years of age. You are older than 56 years of age and your health care provider tells you that you are at risk for this type of infection. Your sexual activity has changed since you were last screened, and you are at increased risk for chlamydia or gonorrhea. Ask your health care provider if you are at risk. Ask your health care provider about whether you are at high risk for HIV. Your health care provider may recommend a prescription medicine to help prevent HIV infection. If you choose to take medicine to prevent HIV, you should first get tested for HIV. You should then be tested every 3 months for as long as you are taking the medicine. Follow these instructions at home: Alcohol use Do not drink alcohol if your health care provider tells you not to drink. If you drink alcohol: Limit how much you have to 0-2 drinks a day. Know how much alcohol is in your drink. In the [place] one drink equals one 12 oz bottle of beer (355 mL), one 5 oz glass of wine (148 mL), or one 1 oz glass of hard liquor (44 mL). Lifestyle Do not use any products that contain nicotine or tobacco. These products include cigarettes, chewing tobacco, and vaping devices, such as e-cigarettes. If you need help quitting, ask your health care provider. Do not use street drugs. Do not share needles. Ask your health care provider for help if you need support or information about quitting drugs. General instructions Schedule regular health, dental, and eye exams. Stay current with your vaccines. Tell your health care provider if: You often feel depressed. You have ever been abused or do not feel safe at home. Summary Adopting a healthy lifestyle and getting preventive care  are important in promoting health and wellness. Follow your health care provider's instructions about healthy diet, exercising, and getting tested or screened for diseases. Follow your health care provider's instructions on monitoring your cholesterol and blood pressure. This information is not intended to replace advice given to you by your health care provider. Make sure you discuss any questions you have with your health care provider. Document Revised: 01/12/2021 Document Reviewed: 01/12/2021 Elsevier Patient Education  2024 [employer].

## 2023-08-23 ENCOUNTER — Encounter: Payer: Self-pay | Admitting: Internal Medicine

## 2023-08-23 ENCOUNTER — Ambulatory Visit (INDEPENDENT_AMBULATORY_CARE_PROVIDER_SITE_OTHER): Payer: 59 | Admitting: Internal Medicine

## 2023-08-23 VITALS — BP 147/77 | HR 86 | Ht 67.0 in | Wt 128.0 lb

## 2023-08-23 DIAGNOSIS — Z1321 Encounter for screening for nutritional disorder: Secondary | ICD-10-CM | POA: Diagnosis not present

## 2023-08-23 DIAGNOSIS — Z131 Encounter for screening for diabetes mellitus: Secondary | ICD-10-CM | POA: Diagnosis not present

## 2023-08-23 DIAGNOSIS — Z23 Encounter for immunization: Secondary | ICD-10-CM | POA: Insufficient documentation

## 2023-08-23 DIAGNOSIS — Z13220 Encounter for screening for lipoid disorders: Secondary | ICD-10-CM

## 2023-08-23 DIAGNOSIS — J45909 Unspecified asthma, uncomplicated: Secondary | ICD-10-CM | POA: Insufficient documentation

## 2023-08-23 DIAGNOSIS — Z720 Tobacco use: Secondary | ICD-10-CM

## 2023-08-23 DIAGNOSIS — Z1329 Encounter for screening for other suspected endocrine disorder: Secondary | ICD-10-CM

## 2023-08-23 DIAGNOSIS — J4520 Mild intermittent asthma, uncomplicated: Secondary | ICD-10-CM

## 2023-08-23 DIAGNOSIS — Z1159 Encounter for screening for other viral diseases: Secondary | ICD-10-CM | POA: Diagnosis not present

## 2023-08-23 DIAGNOSIS — I10 Essential (primary) hypertension: Secondary | ICD-10-CM

## 2023-08-23 DIAGNOSIS — R911 Solitary pulmonary nodule: Secondary | ICD-10-CM | POA: Diagnosis not present

## 2023-08-23 MED ORDER — AMLODIPINE-OLMESARTAN 10-20 MG PO TABS: 1.0000 | ORAL_TABLET | Freq: Every day | ORAL | 2 refills | Status: DC

## 2023-08-23 MED ORDER — ALBUTEROL SULFATE HFA 108 (90 BASE) MCG/ACT IN AERS: 2.0000 | INHALATION_SPRAY | RESPIRATORY_TRACT | 0 refills | Status: AC | PRN

## 2023-08-23 NOTE — Assessment & Plan Note (Signed)
Currently smokes 0.5 packs/day of cigarettes and has been smoking since age 57.  He previously smoked 1 pack/day and gradually working towards cessation. -The patient was counseled on the dangers of tobacco use, and was advised to quit.  Reviewed strategies to maximize success, including removing cigarettes and smoking materials from environment, stress management, substitution of other forms of reinforcement, support of family/friends, and written materials.

## 2023-08-23 NOTE — Progress Notes (Signed)
New Patient Office Visit  Subjective    Patient ID: Mike Keller, male    DOB: 1966-07-14  Age: 57 y.o. MRN: [id number]  CC:  Chief Complaint  Patient presents with   Establish Care   HPI Mike Keller presents to establish care.  He is a 57 year old male who endorses a past medical history significant for HTN, asthma, and tobacco use.  Previously followed at the free clinic of [facility].  Mike Keller reports feeling well today.  He is asymptomatic and has no acute concerns to discuss.  He current he works at [employer]. He endorses current tobacco use, smoking 0.5 packs/day of Rett's and has cut back from 1 pack/day previously.  He has been smoking since age 23.  He endorses social alcohol use and weekly marijuana use.  Family medical history is significant for breast cancer and unspecified cancers in his brother and father.  Chronic medical conditions and outstanding preventative care items discussed today are individually addressed in A/P below.  Outpatient Encounter Medications as of 08/23/2023  Medication Sig   amlodipine-olmesartan (AZOR) 10-20 MG tablet Take 1 tablet by mouth daily.   [DISCONTINUED] amLODipine (NORVASC) 10 MG tablet Take 1 tablet (10 mg total) by mouth daily.   albuterol (VENTOLIN HFA) 108 (90 Base) MCG/ACT inhaler Inhale 2 puffs into the lungs every 4 (four) hours as needed for wheezing or shortness of breath.   [DISCONTINUED] albuterol (VENTOLIN HFA) 108 (90 Base) MCG/ACT inhaler Inhale 2 puffs into the lungs every 4 (four) hours as needed for wheezing or shortness of breath. (Patient not taking: Reported on 08/23/2023)   No facility-administered encounter medications on file as of 08/23/2023.    Past Medical History:  Diagnosis Date   Allergy    Asthma    as a child   Chronic back pain    Hypertension     Past Surgical History:  Procedure Laterality Date   HAND SURGERY     left   HAND SURGERY     HAND SURGERY Left     Family History  Problem  Relation Age of Onset   Asthma Mother    Hypertension Mother    Hypertension Father    Kidney disease Brother    Stroke Brother    Hypertension Brother     Social History   Socioeconomic History   Marital status: Single    Spouse name: Not on file   Number of children: 0   Years of education: 11   Highest education level: Not on file  Occupational History   Occupation: Administrator, [employer], forklift  Tobacco Use   Smoking status: Every Day    Current packs/day: 1.00    Average packs/day: 1 pack/day for 30.0 years (30.0 ttl pk-yrs)    Types: Cigarettes    Start date: 09/06/1993   Smokeless tobacco: Never  Vaping Use   Vaping status: Never Used  Substance and Sexual Activity   Alcohol use: Yes    Alcohol/week: 7.0 standard drinks of alcohol    Types: 7 Cans of beer per week    Comment: beer, 1 cans daily   Drug use: Not Currently    Types: Marijuana    Comment: none since may 2022.   Sexual activity: Yes    Birth control/protection: None  Other Topics Concern   Not on file  Social History Narrative   Lives with girlfriend   Social Drivers of Corporate investment banker Strain: Not on file  Food Insecurity:  Not on file  Transportation Needs: Not on file  Physical Activity: Not on file  Stress: Not on file  Social Connections: Not on file  Intimate Partner Violence: Not on file   Review of Systems  Constitutional:  Negative for chills and fever.  HENT:  Negative for sore throat.   Respiratory:  Negative for cough and shortness of breath.   Cardiovascular:  Negative for chest pain, palpitations and leg swelling.  Gastrointestinal:  Negative for abdominal pain, blood in stool, constipation, diarrhea, nausea and vomiting.  Genitourinary:  Negative for dysuria and hematuria.  Musculoskeletal:  Negative for myalgias.  Skin:  Negative for itching and rash.  Neurological:  Negative for dizziness and headaches.  Psychiatric/Behavioral:  Negative for depression and  suicidal ideas.    Objective    BP (!) 147/77   Pulse 86   Ht 5\' 7"  (1.702 m)   Wt 128 lb (58.1 kg)   SpO2 98%   BMI 20.05 kg/m   Physical Exam Vitals reviewed.  Constitutional:      General: He is not in acute distress.    Appearance: Normal appearance. He is not ill-appearing.  HENT:     Head: Normocephalic and atraumatic.     Right Ear: External ear normal.     Left Ear: External ear normal.     Nose: Nose normal. No congestion or rhinorrhea.     Mouth/Throat:     Mouth: Mucous membranes are moist.     Pharynx: Oropharynx is clear.  Eyes:     General: No scleral icterus.    Extraocular Movements: Extraocular movements intact.     Conjunctiva/sclera: Conjunctivae normal.     Pupils: Pupils are equal, round, and reactive to light.  Cardiovascular:     Rate and Rhythm: Normal rate and regular rhythm.     Pulses: Normal pulses.     Heart sounds: Normal heart sounds. No murmur heard. Pulmonary:     Effort: Pulmonary effort is normal.     Breath sounds: Normal breath sounds. No wheezing, rhonchi or rales.  Abdominal:     General: Abdomen is flat. Bowel sounds are normal. There is no distension.     Palpations: Abdomen is soft.     Tenderness: There is no abdominal tenderness.  Musculoskeletal:        General: No swelling or deformity. Normal range of motion.     Cervical back: Normal range of motion.  Skin:    General: Skin is warm and dry.     Capillary Refill: Capillary refill takes less than 2 seconds.  Neurological:     General: No focal deficit present.     Mental Status: He is alert and oriented to person, place, and time.     Motor: No weakness.  Psychiatric:        Mood and Affect: Mood normal.        Behavior: Behavior normal.        Thought Content: Thought content normal.   Last CBC Lab Results  Component Value Date   WBC 6.4 03/01/2022   HGB 14.3 11/04/2022   HCT 41.7 11/04/2022   MCV 88.8 03/01/2022   MCH 30.2 03/01/2022   RDW 15.7 (H)  03/01/2022   PLT 177 03/01/2022   Last metabolic panel Lab Results  Component Value Date   GLUCOSE 120 (H) 06/16/2023   NA 132 (L) 06/16/2023   K 3.9 06/16/2023   CL 98 06/16/2023   CO2 26 06/16/2023   BUN  6 06/16/2023   CREATININE 0.73 06/16/2023   GFRNONAA >60 06/16/2023   CALCIUM 8.4 (L) 06/16/2023   PROT 6.4 (L) 12/05/2021   ALBUMIN 3.5 12/05/2021   BILITOT 1.5 (H) 12/05/2021   ALKPHOS 50 12/05/2021   AST 38 12/05/2021   ALT 27 12/05/2021   ANIONGAP 8 06/16/2023   Last lipids Lab Results  Component Value Date   CHOL 98 03/18/2021   HDL 67 03/18/2021   LDLCALC 27 03/18/2021   TRIG 19 03/18/2021   CHOLHDL 1.5 03/18/2021   Last hemoglobin A1c Lab Results  Component Value Date   HGBA1C 5.4 03/18/2021   Assessment & Plan:   Problem List Items Addressed This Visit       Hypertension   He is currently prescribed amlodipine 10 mg daily for treatment of hypertension.  His blood pressure is persistently elevated today and he reports systolic readings in the 170's at home. -Discontinue amlodipine and start amlodipine-olmesartan 10-20 mg daily -Follow-up in 4 weeks for HTN and lab review      Pulmonary nodule   3 mm subpleural nodule in the LUL noted on CT chest from November 2023.  There were 2 additional nodules in the left lower lobe measuring 3 mm.  Repeat CT chest without contrast recommended for 12 months. -CT chest wo contrast ordered today      Asthma   He endorses a history of asthma.  Symptoms are adequately controlled with as needed use of albuterol.  Asymptomatic currently.  Pulmonary exam is unremarkable.  Albuterol has been refilled.      Tobacco abuse   Currently smokes 0.5 packs/day of cigarettes and has been smoking since age 81.  He previously smoked 1 pack/day and gradually working towards cessation. -The patient was counseled on the dangers of tobacco use, and was advised to quit.  Reviewed strategies to maximize success, including removing  cigarettes and smoking materials from environment, stress management, substitution of other forms of reinforcement, support of family/friends, and written materials.       Need for Tdap vaccination - Primary   Tdap vaccine administered today      Need for zoster vaccination   Zoster vaccine 1/2 administered today      Return in about 4 weeks (around 09/20/2023) for HTN, Lab Review.   Billie Lade, MD

## 2023-08-23 NOTE — Assessment & Plan Note (Addendum)
He is currently prescribed amlodipine 10 mg daily for treatment of hypertension.  His blood pressure is persistently elevated today and he reports systolic readings in the 170's at home. -Discontinue amlodipine and start amlodipine-olmesartan 10-20 mg daily -Follow-up in 4 weeks for HTN and lab review

## 2023-08-23 NOTE — Assessment & Plan Note (Signed)
3 mm subpleural nodule in the LUL noted on CT chest from November 2023.  There were 2 additional nodules in the left lower lobe measuring 3 mm.  Repeat CT chest without contrast recommended for 12 months. -CT chest wo contrast ordered today

## 2023-08-23 NOTE — Patient Instructions (Signed)
It was a pleasure to see you today.  Thank you for giving [place] the opportunity to be involved in your care.  Below is a brief recap of your visit and next steps.  We will plan to see you again in 1 month.  Summary You have established care today We will check basic labs Start amlodipine-olmesartan 10-20 mg daily for high blood pressure Stop taking amlodipine 10 mg daily CT chest ordered Follow up in 4 weeks for BP check and lab review

## 2023-08-23 NOTE — Assessment & Plan Note (Signed)
-   Tdap vaccine administered today.

## 2023-08-23 NOTE — Assessment & Plan Note (Signed)
He endorses a history of asthma.  Symptoms are adequately controlled with as needed use of albuterol.  Asymptomatic currently.  Pulmonary exam is unremarkable.  Albuterol has been refilled.

## 2023-08-23 NOTE — Assessment & Plan Note (Signed)
Zoster vaccine 1/2 administered today

## 2023-08-24 ENCOUNTER — Other Ambulatory Visit: Payer: Self-pay | Admitting: Internal Medicine

## 2023-08-24 DIAGNOSIS — E559 Vitamin D deficiency, unspecified: Secondary | ICD-10-CM

## 2023-08-24 LAB — CBC WITH DIFFERENTIAL/PLATELET
Basophils Absolute: 0.1 10*3/uL (ref 0.0–0.2)
Basos: 1 %
EOS (ABSOLUTE): 0.1 10*3/uL (ref 0.0–0.4)
Eos: 2 %
Hematocrit: 40.7 % (ref 37.5–51.0)
Hemoglobin: 13.9 g/dL (ref 13.0–17.7)
Immature Grans (Abs): 0 10*3/uL (ref 0.0–0.1)
Immature Granulocytes: 0 %
Lymphocytes Absolute: 2.2 10*3/uL (ref 0.7–3.1)
Lymphs: 32 %
MCH: 34.8 pg — ABNORMAL HIGH (ref 26.6–33.0)
MCHC: 34.2 g/dL (ref 31.5–35.7)
MCV: 102 fL — ABNORMAL HIGH (ref 79–97)
Monocytes Absolute: 1 10*3/uL — ABNORMAL HIGH (ref 0.1–0.9)
Monocytes: 14 %
Neutrophils Absolute: 3.5 10*3/uL (ref 1.4–7.0)
Neutrophils: 51 %
Platelets: 218 10*3/uL (ref 150–450)
RBC: 3.99 x10E6/uL — ABNORMAL LOW (ref 4.14–5.80)
RDW: 12.1 % (ref 11.6–15.4)
WBC: 6.8 10*3/uL (ref 3.4–10.8)

## 2023-08-24 LAB — TSH+FREE T4
Free T4: 1.11 ng/dL (ref 0.82–1.77)
TSH: 0.98 u[IU]/mL (ref 0.450–4.500)

## 2023-08-24 LAB — CMP14+EGFR
ALT: 27 [IU]/L (ref 0–44)
AST: 79 [IU]/L — ABNORMAL HIGH (ref 0–40)
Albumin: 4.7 g/dL (ref 3.8–4.9)
Alkaline Phosphatase: 67 [IU]/L (ref 44–121)
BUN/Creatinine Ratio: 12 (ref 9–20)
BUN: 10 mg/dL (ref 6–24)
Bilirubin Total: 1.3 mg/dL — ABNORMAL HIGH (ref 0.0–1.2)
CO2: 20 mmol/L (ref 20–29)
Calcium: 8.9 mg/dL (ref 8.7–10.2)
Chloride: 99 mmol/L (ref 96–106)
Creatinine, Ser: 0.84 mg/dL (ref 0.76–1.27)
Globulin, Total: 2.7 g/dL (ref 1.5–4.5)
Glucose: 71 mg/dL (ref 70–99)
Potassium: 4 mmol/L (ref 3.5–5.2)
Sodium: 137 mmol/L (ref 134–144)
Total Protein: 7.4 g/dL (ref 6.0–8.5)
eGFR: 102 mL/min/{1.73_m2} (ref >59)

## 2023-08-24 LAB — HEMOGLOBIN A1C
Est. average glucose Bld gHb Est-mCnc: 108 mg/dL
Hgb A1c MFr Bld: 5.4 % (ref 4.8–5.6)

## 2023-08-24 LAB — LIPID PANEL
Chol/HDL Ratio: 1.4 {ratio} (ref 0.0–5.0)
Cholesterol, Total: 132 mg/dL (ref 100–199)
HDL: 93 mg/dL (ref >39)
LDL Chol Calc (NIH): 26 mg/dL (ref 0–99)
Triglycerides: 61 mg/dL (ref 0–149)
VLDL Cholesterol Cal: 13 mg/dL (ref 5–40)

## 2023-08-24 LAB — VITAMIN D 25 HYDROXY (VIT D DEFICIENCY, FRACTURES): Vit D, 25-Hydroxy: 6.1 ng/mL — ABNORMAL LOW (ref 30.0–100.0)

## 2023-08-24 LAB — HCV INTERPRETATION

## 2023-08-24 LAB — B12 AND FOLATE PANEL
Folate: 7.7 ng/mL (ref >3.0)
Vitamin B-12: 506 pg/mL (ref 232–1245)

## 2023-08-24 LAB — HCV AB W REFLEX TO QUANT PCR: HCV Ab: NONREACTIVE

## 2023-08-24 MED ORDER — VITAMIN D (ERGOCALCIFEROL) 1.25 MG (50000 UNIT) PO CAPS: 50000.0000 [IU] | ORAL_CAPSULE | ORAL | 0 refills | Status: DC

## 2023-09-01 ENCOUNTER — Ambulatory Visit
Admission: RE | Admit: 2023-09-01 | Discharge: 2023-09-01 | Disposition: A | Discharge status: Home / Self Care | Payer: 59 | Source: Ambulatory Visit | Attending: Internal Medicine | Admitting: Internal Medicine

## 2023-09-01 DIAGNOSIS — R911 Solitary pulmonary nodule: Secondary | ICD-10-CM

## 2023-09-01 DIAGNOSIS — R918 Other nonspecific abnormal finding of lung field: Secondary | ICD-10-CM | POA: Diagnosis not present

## 2023-09-01 DIAGNOSIS — I2510 Atherosclerotic heart disease of native coronary artery without angina pectoris: Secondary | ICD-10-CM | POA: Diagnosis not present

## 2023-09-02 ENCOUNTER — Ambulatory Visit (HOSPITAL_COMMUNITY): Payer: 59

## 2023-09-13 ENCOUNTER — Telehealth: Payer: Self-pay

## 2023-09-13 NOTE — Telephone Encounter (Signed)
 Awaiting results

## 2023-09-13 NOTE — Telephone Encounter (Signed)
 Copied from CRM [id number]. Topic: Clinical - Lab/Test Results >> Sep 13, 2023  2:49 PM Herbert Seta B wrote: Reason for CRM: Patient calling would like to know results and doctor comment of Chest CT done 12/26.

## 2023-09-20 ENCOUNTER — Ambulatory Visit (INDEPENDENT_AMBULATORY_CARE_PROVIDER_SITE_OTHER): Payer: 59 | Admitting: Internal Medicine

## 2023-09-20 ENCOUNTER — Encounter: Payer: Self-pay | Admitting: Internal Medicine

## 2023-09-20 VITALS — BP 165/92 | HR 91 | Ht 67.0 in | Wt 130.8 lb

## 2023-09-20 DIAGNOSIS — I10 Essential (primary) hypertension: Secondary | ICD-10-CM

## 2023-09-20 DIAGNOSIS — E559 Vitamin D deficiency, unspecified: Secondary | ICD-10-CM | POA: Diagnosis not present

## 2023-09-20 MED ORDER — VITAMIN D (ERGOCALCIFEROL) 1.25 MG (50000 UNIT) PO CAPS: 50000.0000 [IU] | ORAL_CAPSULE | ORAL | 0 refills | Status: DC

## 2023-09-20 MED ORDER — AMLODIPINE-OLMESARTAN 10-40 MG PO TABS: 1.0000 | ORAL_TABLET | Freq: Every day | ORAL | 3 refills | Status: DC

## 2023-09-20 NOTE — Assessment & Plan Note (Addendum)
 Presenting today for HTN follow-up.  Amlodipine was discontinued in favor of amlodipine-olmesartan 10-20 mg daily at his last appointment.  BP today remains elevated. -Increase amlodipine-olmesartan to 10-40 mg daily

## 2023-09-20 NOTE — Progress Notes (Signed)
 Established Patient Office Visit  Subjective   Patient ID: Mike Keller, male    DOB: 09/11/1965  Age: 58 y.o. MRN: [id number]  Chief Complaint  Patient presents with   Care Management    Four week follow up    Mr. Mike Keller returns to care today for HTN follow-up and lab review.  He was last evaluated by me as a new patient presenting to establish care on 08/23/23.  His blood pressure was significantly elevated at that time.  Amlodipine  was discontinued in favor of amlodipine -olmesartan  10-20 mg daily.  Repeat CT chest was ordered for evaluation of previously identified pulmonary nodules.  Basic labs were ordered and 4-week follow-up was arranged.  There have been no acute interval events.  Mr. Mike Keller reports feeling well today.  He is asymptomatic and has no acute concerns to discuss.  Past Medical History:  Diagnosis Date   Allergy    Asthma    as a child   Chronic back pain    Hypertension    Past Surgical History:  Procedure Laterality Date   HAND SURGERY     left   HAND SURGERY     HAND SURGERY Left    Social History   Tobacco Use   Smoking status: Every Day    Current packs/day: 1.00    Average packs/day: 1 pack/day for 30.0 years (30.0 ttl pk-yrs)    Types: Cigarettes    Start date: 09/06/1993   Smokeless tobacco: Never  Vaping Use   Vaping status: Never Used  Substance Use Topics   Alcohol use: Yes    Alcohol/week: 7.0 standard drinks of alcohol    Types: 7 Cans of beer per week    Comment: beer, 1 cans daily   Drug use: Not Currently    Types: Marijuana    Comment: none since may 2022.   Family History  Problem Relation Age of Onset   Asthma Mother    Hypertension Mother    Hypertension Father    Kidney disease Brother    Stroke Brother    Hypertension Brother    Allergies  Allergen Reactions   Lisinopril  Swelling    Angioedema    Review of Systems  Constitutional:  Negative for chills and fever.  HENT:  Negative for sore throat.   Respiratory:   Negative for cough and shortness of breath.   Cardiovascular:  Negative for chest pain, palpitations and leg swelling.  Gastrointestinal:  Negative for abdominal pain, blood in stool, constipation, diarrhea, nausea and vomiting.  Genitourinary:  Negative for dysuria and hematuria.  Musculoskeletal:  Negative for myalgias.  Skin:  Negative for itching and rash.  Neurological:  Negative for dizziness and headaches.  Psychiatric/Behavioral:  Negative for depression and suicidal ideas.      Objective:     BP (!) 165/92 (BP Location: Right Arm, Patient Position: Sitting, Cuff Size: Normal)   Pulse 91   Ht 5' 7 (1.702 m)   Wt 130 lb 12.8 oz (59.3 kg)   SpO2 99%   BMI 20.49 kg/m  BP Readings from Last 3 Encounters:  09/20/23 (!) 165/92  08/23/23 (!) 147/77  08/18/23 (!) 153/94   Physical Exam Vitals reviewed.  Constitutional:      General: He is not in acute distress.    Appearance: Normal appearance. He is not ill-appearing.  HENT:     Head: Normocephalic and atraumatic.     Right Ear: External ear normal.     Left Ear: External  ear normal.     Nose: Nose normal. No congestion or rhinorrhea.     Mouth/Throat:     Mouth: Mucous membranes are moist.     Pharynx: Oropharynx is clear.  Eyes:     General: No scleral icterus.    Extraocular Movements: Extraocular movements intact.     Conjunctiva/sclera: Conjunctivae normal.     Pupils: Pupils are equal, round, and reactive to light.  Cardiovascular:     Rate and Rhythm: Normal rate and regular rhythm.     Pulses: Normal pulses.     Heart sounds: Normal heart sounds. No murmur heard. Pulmonary:     Effort: Pulmonary effort is normal.     Breath sounds: Normal breath sounds. No wheezing, rhonchi or rales.  Abdominal:     General: Abdomen is flat. Bowel sounds are normal. There is no distension.     Palpations: Abdomen is soft.     Tenderness: There is no abdominal tenderness.  Musculoskeletal:        General: No swelling or  deformity. Normal range of motion.     Cervical back: Normal range of motion.  Skin:    General: Skin is warm and dry.     Capillary Refill: Capillary refill takes less than 2 seconds.  Neurological:     General: No focal deficit present.     Mental Status: He is alert and oriented to person, place, and time.     Motor: No weakness.  Psychiatric:        Mood and Affect: Mood normal.        Behavior: Behavior normal.        Thought Content: Thought content normal.   Last CBC Lab Results  Component Value Date   WBC 6.8 08/23/2023   HGB 13.9 08/23/2023   HCT 40.7 08/23/2023   MCV 102 (H) 08/23/2023   MCH 34.8 (H) 08/23/2023   RDW 12.1 08/23/2023   PLT 218 08/23/2023   Last metabolic panel Lab Results  Component Value Date   GLUCOSE 71 08/23/2023   NA 137 08/23/2023   K 4.0 08/23/2023   CL 99 08/23/2023   CO2 20 08/23/2023   BUN 10 08/23/2023   CREATININE 0.84 08/23/2023   EGFR 102 08/23/2023   CALCIUM 8.9 08/23/2023   PROT 7.4 08/23/2023   ALBUMIN 4.7 08/23/2023   LABGLOB 2.7 08/23/2023   BILITOT 1.3 (H) 08/23/2023   ALKPHOS 67 08/23/2023   AST 79 (H) 08/23/2023   ALT 27 08/23/2023   ANIONGAP 8 06/16/2023   Last lipids Lab Results  Component Value Date   CHOL 132 08/23/2023   HDL 93 08/23/2023   LDLCALC 26 08/23/2023   TRIG 61 08/23/2023   CHOLHDL 1.4 08/23/2023   Last hemoglobin A1c Lab Results  Component Value Date   HGBA1C 5.4 08/23/2023   Last thyroid functions Lab Results  Component Value Date   TSH 0.980 08/23/2023   Last vitamin D  Lab Results  Component Value Date   VD25OH 6.1 (L) 08/23/2023   Last vitamin B12 and Folate Lab Results  Component Value Date   VITAMINB12 506 08/23/2023   FOLATE 7.7 08/23/2023   The 10-year ASCVD risk score (Arnett DK, et al., 2019) is: 22.9%    Assessment & Plan:   Problem List Items Addressed This Visit       Hypertension - Primary   Presenting today for HTN follow-up.  Amlodipine  was discontinued  in favor of amlodipine -olmesartan  10-20 mg daily at his last  appointment.  BP today remains elevated. -Increase amlodipine -olmesartan  to 10-40 mg daily      Vitamin D  deficiency   Noted on labs obtained last month.  High-dose, weekly vitamin D  supplementation was prescribed but has not been started.  Prescription placed today.  Repeat vitamin D  level upon completion of high-dose, weekly vitamin D  supplementation.      Return in about 3 months (around 12/19/2023).   Manus FORBES Fireman, MD

## 2023-09-20 NOTE — Assessment & Plan Note (Signed)
 Noted on labs obtained last month.  High-dose, weekly vitamin D supplementation was prescribed but has not been started.  Prescription placed today.  Repeat vitamin D level upon completion of high-dose, weekly vitamin D supplementation.

## 2023-09-20 NOTE — Patient Instructions (Signed)
 It was a pleasure to see you today.  Thank you for giving us  the opportunity to be involved in your care.  Below is a brief recap of your visit and next steps.  We will plan to see you again in 3 months.  Summary Increase amlodipine -olmesartan  to 10--40 mg daily Weekly vitamin D  supplement reordered Follow up in 3 months

## 2023-10-13 IMAGING — DX DG RIBS W/ CHEST 3+V*R*
5 series · 5 of 5 positions shown · non-contrast
Comparison: None.

CLINICAL DATA: Pain

EXAM:
RIGHT RIBS AND CHEST - 3 VIEW

[chest pa]
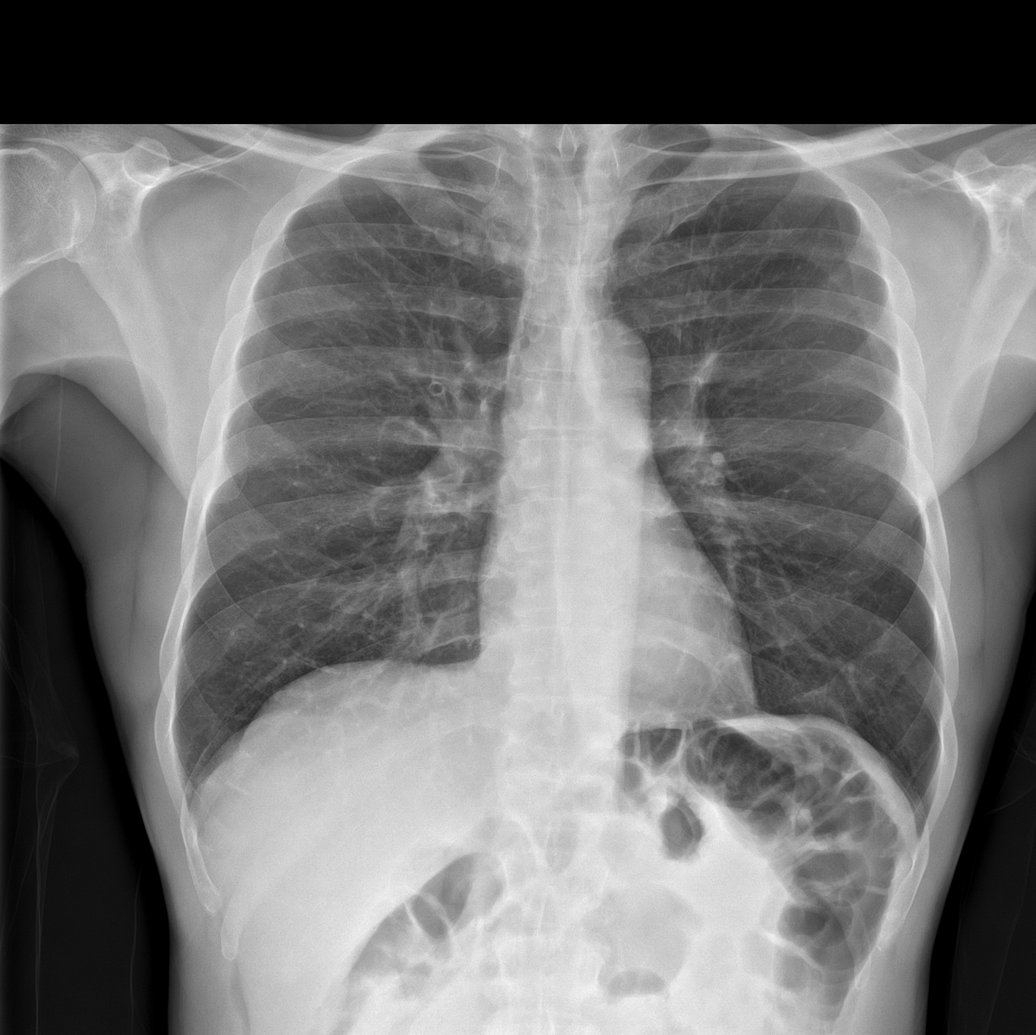

[rib pa (1 of 2)]
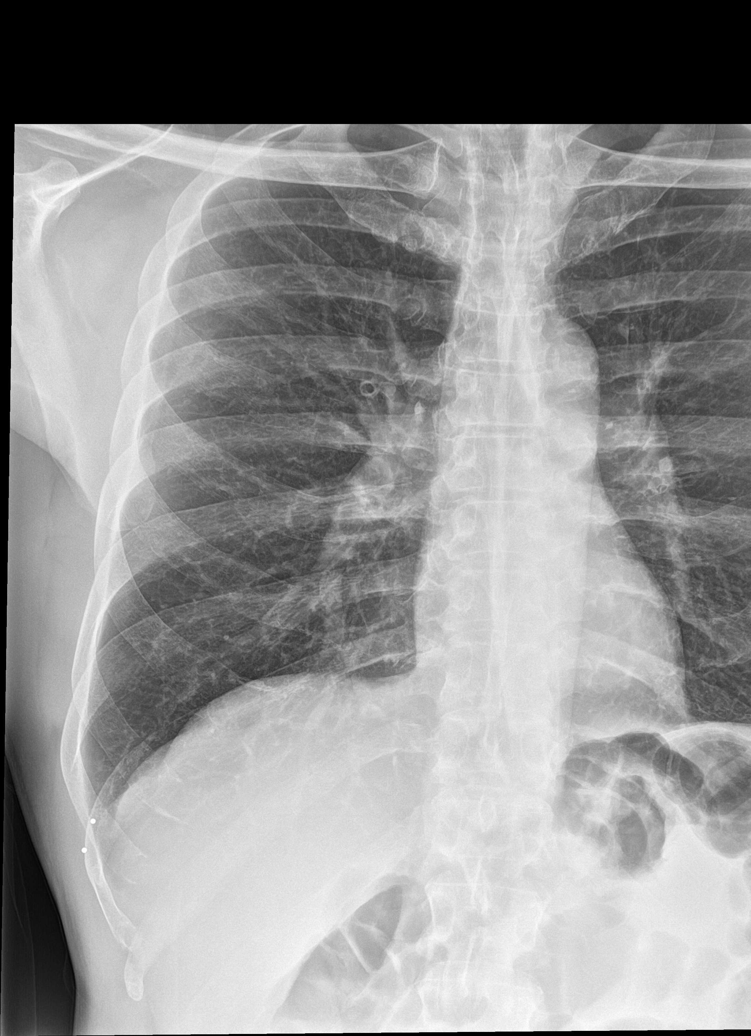

[rib pa (2 of 2)]
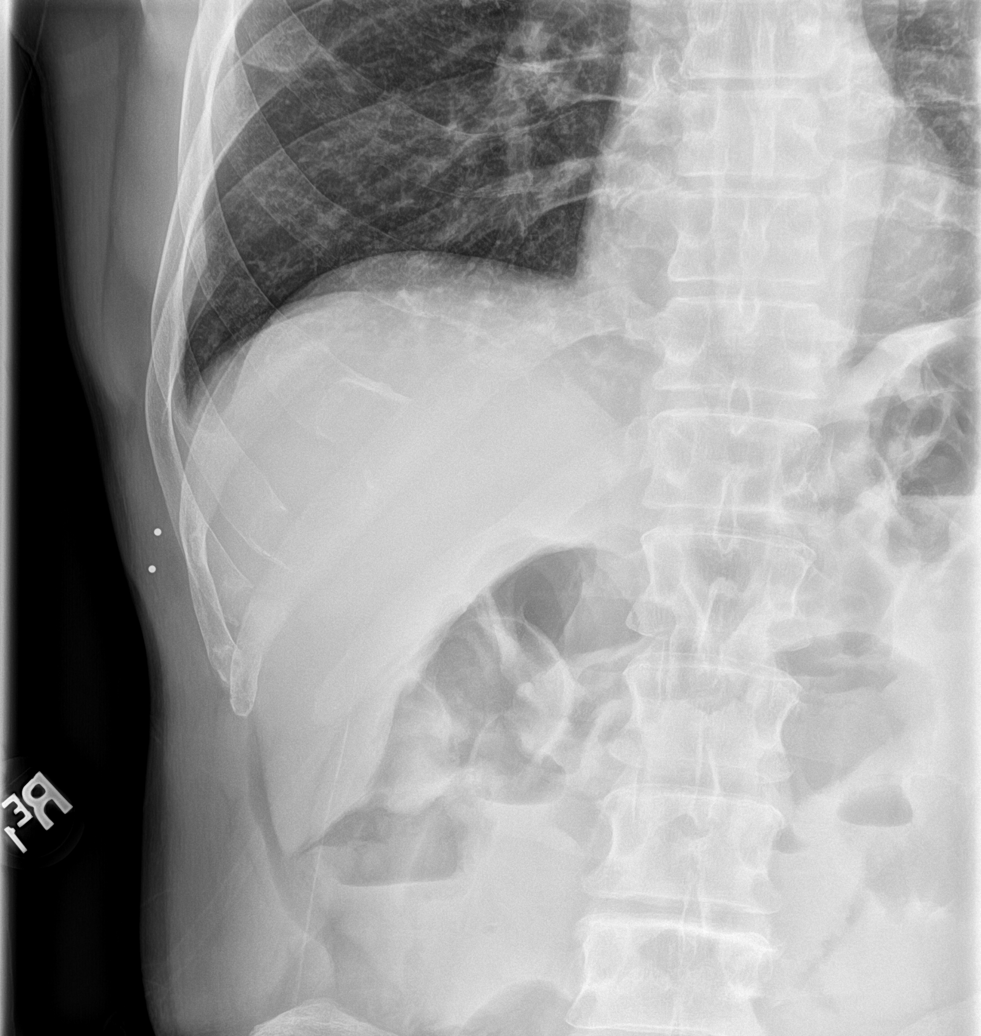

[rib obl (1 of 2)]
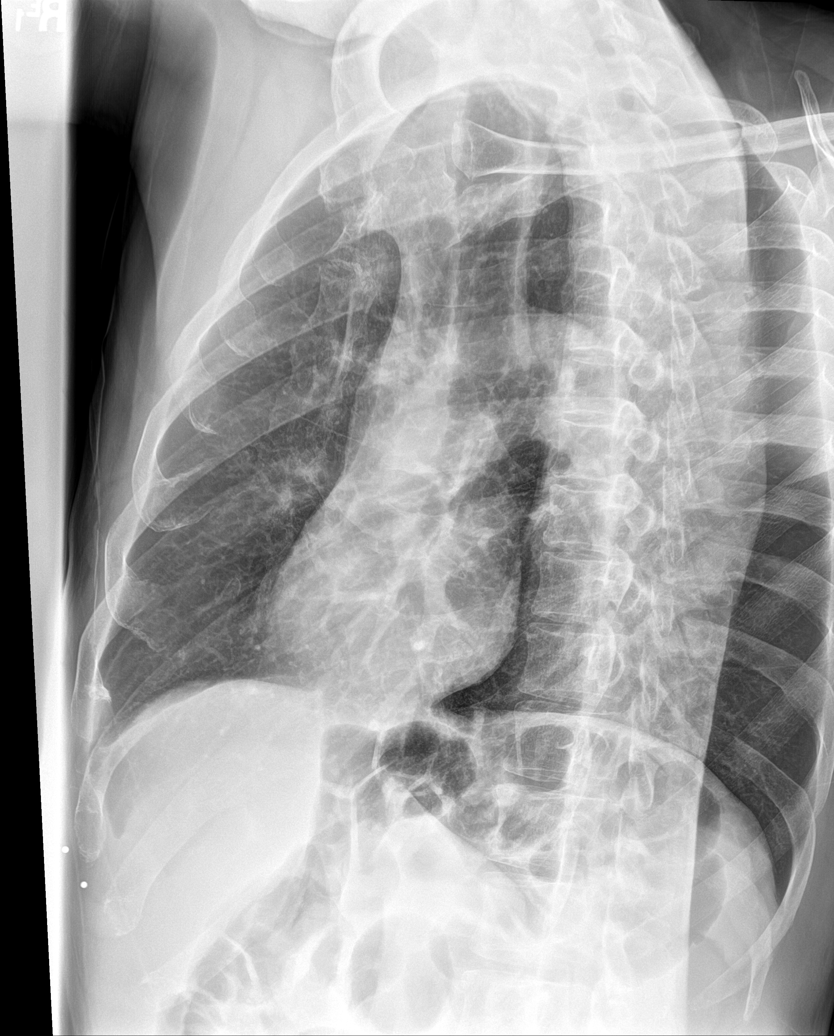

[rib obl (2 of 2)]
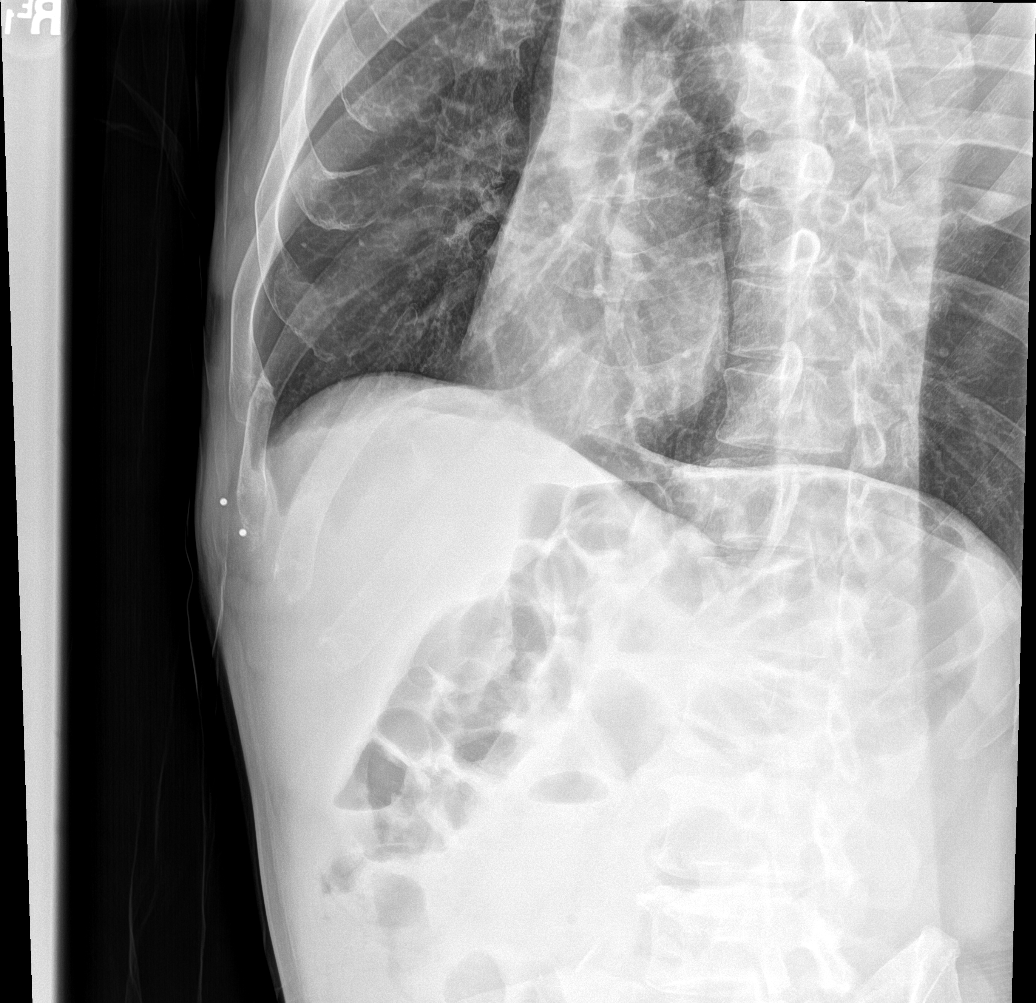

[5 of 5 positions shown; findings below may reference images not displayed]

FINDINGS: No fracture or other bone lesions are seen involving the ribs. There
is no evidence of pneumothorax or pleural effusion. Both lungs are
clear. Heart size and mediastinal contours are within normal limits.
IMPRESSION: No evidence of displaced rib fracture.  Lungs are clear.

## 2023-12-20 ENCOUNTER — Ambulatory Visit: Payer: 59 | Admitting: Internal Medicine

## 2024-02-06 ENCOUNTER — Ambulatory Visit: Admitting: Internal Medicine

## 2024-02-15 ENCOUNTER — Encounter: Payer: Self-pay | Admitting: Internal Medicine

## 2024-03-05 ENCOUNTER — Emergency Department (HOSPITAL_COMMUNITY): Payer: Self-pay

## 2024-03-05 ENCOUNTER — Encounter (HOSPITAL_COMMUNITY): Payer: Self-pay

## 2024-03-05 ENCOUNTER — Emergency Department (HOSPITAL_COMMUNITY)
Admission: EM | Admit: 2024-03-05 | Discharge: 2024-03-05 | Disposition: A | Discharge status: Home / Self Care | Payer: Self-pay | Attending: Emergency Medicine | Admitting: Emergency Medicine

## 2024-03-05 ENCOUNTER — Other Ambulatory Visit: Payer: Self-pay

## 2024-03-05 DIAGNOSIS — M7051 Other bursitis of knee, right knee: Secondary | ICD-10-CM | POA: Insufficient documentation

## 2024-03-05 DIAGNOSIS — Y9389 Activity, other specified: Secondary | ICD-10-CM | POA: Insufficient documentation

## 2024-03-05 NOTE — ED Provider Notes (Signed)
 [facility] EMERGENCY DE[facility]MENT AT [facility] Provider Note   CSN: [id number] Arrival date & time: 03/05/24  8063     Patient presents with: Knee Pain   Mike Keller is a 58 y.o. male.   58 year old male previously healthy presents emergency department with atraumatic swelling and pain on the lateral aspect of his right knee.  Has been present for 3 weeks.  No known injuries.  Says that most the time it does not hurt unless he moves it a certain way.  Does work as a Museum/gallery exhibitions officer and has to get up from a seated position and sit back down repetitively during the day.  Denies any fevers or chills.  No surgeries to the joint.       Prior to Admission medications   Medication Sig Start Date End Date Taking? Authorizing Provider  albuterol  (VENTOLIN  HFA) 108 (90 Base) MCG/ACT inhaler Inhale 2 puffs into the lungs every 4 (four) hours as needed for wheezing or shortness of breath. 08/23/23   Melvenia Manus BRAVO, MD  amLODipine -olmesartan  (AZOR ) 10-40 MG tablet Take 1 tablet by mouth daily. 09/20/23   Melvenia Manus BRAVO, MD  Vitamin D , Ergocalciferol , (DRISDOL ) 1.25 MG (50000 UNIT) CAPS capsule Take 1 capsule (50,000 Units total) by mouth every 7 (seven) days. 09/20/23   Melvenia Manus BRAVO, MD    Allergies: Lisinopril     Review of Systems  Updated Vital Signs BP 121/70 (BP Location: Right Arm)   Pulse 91   Temp 97.8 F (36.6 C) (Oral)   Resp 18   Ht 5' 7 (1.702 m)   Wt 60.3 kg   SpO2 98%   BMI 20.83 kg/m   Physical Exam  Musculoskeletal:        General: Normal range of motion.     Right lower leg: No edema.     Left lower leg: No edema.     Comments: DP pulses 2+ bilaterally.  Bilateral feet appear warm well-perfused.  3 cm fluid-filled cyst palpated in the lateral aspect of the right knee superior to the knee joint.  No overlying erythema or warmth.  Full range of motion of the right knee.  No joint effusion of the right knee noted.     (all labs ordered are  listed, but only abnormal results are displayed) Labs Reviewed  BODY FLUID CULTURE W GRAM STAIN  BODY FLUID CELL COUNT WITH DIFFERENTIAL  SYNOVIAL FLUID, CRYSTAL    EKG: None  Radiology: DG Knee Right Port Result Date: 03/05/2024 CLINICAL DATA:  Lateral knee pain with a lesional edema EXAM: PORTABLE RIGHT KNEE - 1-2 VIEW COMPARISON:  09/01/2018 FINDINGS: No evidence of fracture, dislocation, or joint effusion. No evidence of arthropathy or other focal bone abnormality. Soft tissues are unremarkable. IMPRESSION: Negative. Electronically Signed   By: Norman Gatlin M.D.   On: 03/05/2024 20:17     Fine needle aspiration  Date/Time: 03/05/2024 10:17 PM  Performed by: Yolande Lamar BROCKS, MD Authorized by: Yolande Lamar BROCKS, MD  Consent: Verbal consent obtained Risks and benefits: risks, benefits and alternatives were discussed Consent given by: patient Required items: required blood products, implants, devices, and special equipment available Patient identity confirmed: verbally with patient Time out: Immediately prior to procedure a time out was called to verify the correct patient, procedure, equipment, support staff and site/side marked as required. Patient tolerance: patient tolerated the procedure well with no immediate complications Comments: Aspiration of 10 mL of fluid from the cyst on the lateral aspect  of the patient's right knee.  Straw-colored and clear.       Medications Ordered in the ED - No data to display                                  Medical Decision Making Amount and/or Complexity of Data Reviewed Labs: ordered. Radiology: ordered.   58 year old male who presents emergency department with atraumatic pain and swelling of the lateral aspect of the right knee  Initial Ddx:  Bursitis, septic bursitis, lipoma, abscess, infection, septic joint, fracture  MDM/Course:  Patient presents emergency department with atraumatic pain and swelling of the lateral  aspect of his right knee.  It is just superior to the knee joint.  Does have what appears to be bursitis.  No overlying erythema or warmth to suggest septic bursitis.  Does not have any swelling of the knee joint itself to suggest septic arthritis or right knee effusion.  Had x-rays from triage that did not show acute abnormality.  Discussed treatment options with the patient including conservative versus aspiration in the emergency department and drainage.  Did explain to the patient if we perform an aspiration and drainage that the fluid is very likely to recur though he may experience temporary relief from this.  He states that he would prefer to have the fluid drained which was performed at the bedside without difficulty.  Did send off fluid analysis and Gram stain to ensure that it is not infected.  Will be called with the results of positive.  Will have him follow-up with orthopedics for continued evaluation.   This patient presents to the ED for concern of complaints listed in HPI, this involves an extensive number of treatment options, and is a complaint that carries with it a high risk of complications and morbidity. Disposition including potential need for admission considered.   Dispo: DC Home. Return precautions discussed including, but not limited to, those listed in the AVS. Allowed pt time to ask questions which were answered fully prior to dc.  Additional history obtained from spouse Records reviewed Outpatient Clinic Notes I independently reviewed the following imaging with scope of interpretation limited to determining acute life threatening conditions related to emergency care: Extremity x-ray(s) and agree with the radiologist interpretation with the following exceptions: none I have reviewed the patients home medications and made adjustments as needed  Portions of this note were generated with Dragon dictation software. Dictation errors may occur despite best attempts at proofreading.      Final diagnoses:  Bursitis of other bursa of right knee    ED Discharge Orders     None          Yolande Lamar BROCKS, MD 03/05/24 2221

## 2024-03-05 NOTE — ED Notes (Signed)
 ED Provider at bedside.

## 2024-03-05 NOTE — Discharge Instructions (Signed)
 You were seen for your knee pain and swelling (bursitis) in the emergency department.   At home, please use the compression wrap that we have given you.  Ice your knee to try and limit the swelling.  Use Tylenol  and ibuprofen  for your pain.  Check your MyChart online for the results of any tests that had not resulted by the time you left the emergency department.   Follow-up with your primary doctor in 2-3 days regarding your visit.  Follow-up with orthopedics about your bursitis.  Return immediately to the emergency department if you experience any of the following: Fever, worsening pain, or any other concerning symptoms.    Thank you for visiting our Emergency Department. It was a pleasure taking care of you today.

## 2024-03-05 NOTE — ED Triage Notes (Signed)
 Pt to ED from home wth c/o right knee pain. Pt has knot to lateral portion of right knee x 3 weeks. Pt denies injury.

## 2024-03-06 LAB — SYNOVIAL FLUID, CRYSTAL: Crystals, Fluid: NONE SEEN

## 2024-03-06 LAB — BODY FLUID CELL COUNT WITH DIFFERENTIAL
Lymphs, Fluid: 50 %
Monocyte-Macrophage-Serous Fluid: 30 % — ABNORMAL LOW (ref 50–90)
Neutrophil Count, Fluid: 20 % (ref 0–25)
Total Nucleated Cell Count, Fluid: 66 uL (ref 0–1000)

## 2024-03-09 LAB — BODY FLUID CULTURE W GRAM STAIN: Culture: NO GROWTH

## 2024-03-11 ENCOUNTER — Encounter (HOSPITAL_COMMUNITY): Payer: Self-pay | Admitting: *Deleted

## 2024-03-11 ENCOUNTER — Emergency Department (HOSPITAL_COMMUNITY)
Admission: EM | Admit: 2024-03-11 | Discharge: 2024-03-11 | Disposition: A | Discharge status: Home / Self Care | Payer: Self-pay | Attending: Emergency Medicine | Admitting: Emergency Medicine

## 2024-03-11 ENCOUNTER — Other Ambulatory Visit: Payer: Self-pay

## 2024-03-11 DIAGNOSIS — M7051 Other bursitis of knee, right knee: Secondary | ICD-10-CM | POA: Insufficient documentation

## 2024-03-11 DIAGNOSIS — Y9389 Activity, other specified: Secondary | ICD-10-CM | POA: Insufficient documentation

## 2024-03-11 MED ORDER — HYDROCODONE-ACETAMINOPHEN 5-325 MG PO TABS
1.0000 | ORAL_TABLET | Freq: Once | ORAL | Status: AC
  Administered 2024-03-11: 1 via ORAL
  Filled 2024-03-11: qty 1

## 2024-03-11 MED ORDER — LIDOCAINE 5 % EX PTCH
1.0000 | MEDICATED_PATCH | CUTANEOUS | Status: DC
  Administered 2024-03-11: 1 via TRANSDERMAL
  Filled 2024-03-11: qty 1

## 2024-03-11 MED ORDER — LIDOCAINE 5 % EX PTCH: 1.0000 | MEDICATED_PATCH | CUTANEOUS | 0 refills | Status: AC

## 2024-03-11 MED ORDER — KETOROLAC TROMETHAMINE 15 MG/ML IJ SOLN
15.0000 mg | Freq: Once | INTRAMUSCULAR | Status: AC
  Administered 2024-03-11: 15 mg via INTRAMUSCULAR
  Filled 2024-03-11: qty 1

## 2024-03-11 MED ORDER — MELOXICAM 7.5 MG PO TABS: 7.5000 mg | ORAL_TABLET | Freq: Every day | ORAL | 0 refills | Status: DC

## 2024-03-11 NOTE — ED Provider Notes (Signed)
 [place] EMERGENCY DEPARTMENT AT [facility] Provider Note   CSN: [id number] Arrival date & time: 03/11/24  8161     Patient presents with: Knee Pain   Mike Keller is a 58 y.o. male.   Presents to the ER for left knee pain, he has a knot on the lateral aspect of the the right knee.  He was seen for this on 6/30 and diagnosed with bursitis.  At that time he elected to have this drained but states it feels very backed up and is still painful.  He has been taking Tylenol  and ibuprofen  for pain without any relief.  He has appointment with orthopedics tomorrow scheduled but states that when he walks he has a lot of pain and was hoping for something to help control this.  Denies injury or trauma, no fevers or chills, no redness or warmth.    Knee Pain      Prior to Admission medications   Medication Sig Start Date End Date Taking? Authorizing Provider  albuterol  (VENTOLIN  HFA) 108 (90 Base) MCG/ACT inhaler Inhale 2 puffs into the lungs every 4 (four) hours as needed for wheezing or shortness of breath. 08/23/23   Melvenia Manus BRAVO, MD  amLODipine -olmesartan  (AZOR ) 10-40 MG tablet Take 1 tablet by mouth daily. 09/20/23   Melvenia Manus BRAVO, MD  Vitamin D , Ergocalciferol , (DRISDOL ) 1.25 MG (50000 UNIT) CAPS capsule Take 1 capsule (50,000 Units total) by mouth every 7 (seven) days. 09/20/23   Melvenia Manus BRAVO, MD    Allergies: Lisinopril     Review of Systems  Updated Vital Signs BP 119/74 (BP Location: Right Arm)   Pulse 93   Temp 98.2 F (36.8 C)   Resp 16   Ht 5' 7 (1.702 m)   Wt 59.9 kg   SpO2 97%   BMI 20.67 kg/m   Physical Exam Vitals and nursing note reviewed.  Constitutional:      General: He is not in acute distress.    Appearance: He is well-developed.  HENT:     Head: Normocephalic and atraumatic.     Mouth/Throat:     Mouth: Mucous membranes are moist.  Eyes:     Conjunctiva/sclera: Conjunctivae normal.  Cardiovascular:     Rate and Rhythm: Normal  rate and regular rhythm.     Heart sounds: No murmur heard. Pulmonary:     Effort: Pulmonary effort is normal. No respiratory distress.     Breath sounds: Normal breath sounds.  Abdominal:     Palpations: Abdomen is soft.     Tenderness: There is no abdominal tenderness.  Musculoskeletal:        General: No swelling.     Cervical back: Neck supple.     Comments: Soft fluctuant area of swelling right lateral knee with overlying tenderness.  No redness or warmth, no crepitus.  Full range of motion of left knee, distal pulses intact.  Skin:    General: Skin is warm and dry.     Capillary Refill: Capillary refill takes less than 2 seconds.  Neurological:     General: No focal deficit present.     Mental Status: He is alert and oriented to person, place, and time.  Psychiatric:        Mood and Affect: Mood normal.     (all labs ordered are listed, but only abnormal results are displayed) Labs Reviewed - No data to display  EKG: None  Radiology: No results found.   Procedures   Medications  Ordered in the ED  lidocaine  (LIDODERM ) 5 % 1 patch (has no administration in time range)  HYDROcodone -acetaminophen  (NORCO/VICODIN) 5-325 MG per tablet 1 tablet (has no administration in time range)  ketorolac  (TORADOL ) 15 MG/ML injection 15 mg (has no administration in time range)                                    Medical Decision Making Differential diagnose includes not limited to arthritis, contusion, bursitis, septic aneurysm, suppurative bursitis, lipoma, other  ED course: Patient here with recurrent pain to the right knee after aspiration of inflamed bursa on 6/30, states the swelling came back pretty quickly and he has been having some increased pain.  There is no sign of infection, no sign of infected joint, he has appoint with orthopedics tomorrow and primarily is here for pain control, will apply lidocaine  patch, give a dose of Norco and IM Toradol  and have him follow with  orthopedics.  Also given knee sleeve to help with compression.  He has been using Ace wrap at home.  He is advised on follow-up return precautions.  Risk Prescription drug management.        Final diagnoses:  None    ED Discharge Orders     None          Suellen Sherran DELENA DEVONNA 03/11/24 2027    Francesca Elsie CROME, MD 03/12/24 1144

## 2024-03-11 NOTE — Discharge Instructions (Addendum)
 Please keep your appointment with Subutex as scheduled for tomorrow for your knee pain.  We give you some pain medications and I am prescribing meloxicam  to help with pain as well as lidocaine  patches.  You can also wear the knee brace.  Try to rest the knee. Come back if you have redness, fever or other worrisome changes.  Do not take ibuprofen , aspirin or Aleve  with the meloxicam  as they are similar.  It is okay to take over-the-counter Tylenol 

## 2024-03-11 NOTE — ED Triage Notes (Signed)
 Pt with right knee pain, seen for same on 6/30 and had it drained.  Pt states the swelling has returned.

## 2024-03-15 ENCOUNTER — Encounter (HOSPITAL_COMMUNITY): Payer: Self-pay | Admitting: Emergency Medicine

## 2024-03-15 ENCOUNTER — Inpatient Hospital Stay (HOSPITAL_COMMUNITY)
Admission: EM | Admit: 2024-03-15 | Discharge: 2024-03-16 | DRG: 641 | Disposition: A | Discharge status: Home / Self Care | Payer: Self-pay | Attending: Family Medicine | Admitting: Family Medicine

## 2024-03-15 ENCOUNTER — Emergency Department (HOSPITAL_COMMUNITY): Payer: Self-pay

## 2024-03-15 DIAGNOSIS — F10929 Alcohol use, unspecified with intoxication, unspecified: Secondary | ICD-10-CM | POA: Insufficient documentation

## 2024-03-15 DIAGNOSIS — Z791 Long term (current) use of non-steroidal anti-inflammatories (NSAID): Secondary | ICD-10-CM

## 2024-03-15 DIAGNOSIS — Z79899 Other long term (current) drug therapy: Secondary | ICD-10-CM

## 2024-03-15 DIAGNOSIS — F10129 Alcohol abuse with intoxication, unspecified: Secondary | ICD-10-CM | POA: Diagnosis present

## 2024-03-15 DIAGNOSIS — E871 Hypo-osmolality and hyponatremia: Principal | ICD-10-CM | POA: Diagnosis present

## 2024-03-15 DIAGNOSIS — Z888 Allergy status to other drugs, medicaments and biological substances status: Secondary | ICD-10-CM

## 2024-03-15 DIAGNOSIS — Z823 Family history of stroke: Secondary | ICD-10-CM

## 2024-03-15 DIAGNOSIS — D649 Anemia, unspecified: Secondary | ICD-10-CM | POA: Diagnosis present

## 2024-03-15 DIAGNOSIS — R0789 Other chest pain: Secondary | ICD-10-CM | POA: Diagnosis present

## 2024-03-15 DIAGNOSIS — I10 Essential (primary) hypertension: Secondary | ICD-10-CM | POA: Diagnosis present

## 2024-03-15 DIAGNOSIS — Y907 Blood alcohol level of 200-239 mg/100 ml: Secondary | ICD-10-CM | POA: Diagnosis present

## 2024-03-15 DIAGNOSIS — Z841 Family history of disorders of kidney and ureter: Secondary | ICD-10-CM

## 2024-03-15 DIAGNOSIS — F17210 Nicotine dependence, cigarettes, uncomplicated: Secondary | ICD-10-CM | POA: Diagnosis present

## 2024-03-15 DIAGNOSIS — E860 Dehydration: Secondary | ICD-10-CM | POA: Diagnosis present

## 2024-03-15 DIAGNOSIS — J4520 Mild intermittent asthma, uncomplicated: Secondary | ICD-10-CM

## 2024-03-15 DIAGNOSIS — J45909 Unspecified asthma, uncomplicated: Secondary | ICD-10-CM | POA: Diagnosis present

## 2024-03-15 DIAGNOSIS — Z8249 Family history of ischemic heart disease and other diseases of the circulatory system: Secondary | ICD-10-CM

## 2024-03-15 DIAGNOSIS — F10920 Alcohol use, unspecified with intoxication, uncomplicated: Secondary | ICD-10-CM

## 2024-03-15 DIAGNOSIS — Z825 Family history of asthma and other chronic lower respiratory diseases: Secondary | ICD-10-CM

## 2024-03-15 DIAGNOSIS — R7989 Other specified abnormal findings of blood chemistry: Secondary | ICD-10-CM | POA: Diagnosis present

## 2024-03-15 DIAGNOSIS — Z7982 Long term (current) use of aspirin: Secondary | ICD-10-CM

## 2024-03-15 LAB — CBC
HCT: 33.1 % — ABNORMAL LOW (ref 39.0–52.0)
Hemoglobin: 12.1 g/dL — ABNORMAL LOW (ref 13.0–17.0)
MCH: 34.9 pg — ABNORMAL HIGH (ref 26.0–34.0)
MCHC: 36.6 g/dL — ABNORMAL HIGH (ref 30.0–36.0)
MCV: 95.4 fL (ref 80.0–100.0)
Platelets: 164 K/uL (ref 150–400)
RBC: 3.47 MIL/uL — ABNORMAL LOW (ref 4.22–5.81)
RDW: 12.4 % (ref 11.5–15.5)
WBC: 6.1 K/uL (ref 4.0–10.5)
nRBC: 0 % (ref 0.0–0.2)

## 2024-03-15 LAB — CK: Total CK: 385 U/L (ref 49–397)

## 2024-03-15 LAB — BASIC METABOLIC PANEL WITH GFR
Anion gap: 14 (ref 5–15)
BUN: 6 mg/dL (ref 6–20)
CO2: 18 mmol/L — ABNORMAL LOW (ref 22–32)
Calcium: 8.5 mg/dL — ABNORMAL LOW (ref 8.9–10.3)
Chloride: 90 mmol/L — ABNORMAL LOW (ref 98–111)
Creatinine, Ser: 0.64 mg/dL (ref 0.61–1.24)
GFR, Estimated: >60 mL/min (ref >60)
Glucose, Bld: 82 mg/dL (ref 70–99)
Potassium: 4.6 mmol/L (ref 3.5–5.1)
Sodium: 122 mmol/L — ABNORMAL LOW (ref 135–145)

## 2024-03-15 LAB — TROPONIN I (HIGH SENSITIVITY)
Troponin I (High Sensitivity): 4 ng/L (ref <18)
Troponin I (High Sensitivity): 4 ng/L (ref <18)

## 2024-03-15 LAB — ETHANOL: Alcohol, Ethyl (B): 203 mg/dL — ABNORMAL HIGH (ref <15)

## 2024-03-15 MED ORDER — MORPHINE SULFATE (PF) 4 MG/ML IV SOLN
4.0000 mg | Freq: Once | INTRAVENOUS | Status: AC
  Administered 2024-03-15: 4 mg via INTRAVENOUS
  Filled 2024-03-15: qty 1

## 2024-03-15 MED ORDER — SODIUM CHLORIDE 0.9 % IV BOLUS
1000.0000 mL | Freq: Once | INTRAVENOUS | Status: AC
  Administered 2024-03-15: 1000 mL via INTRAVENOUS

## 2024-03-15 MED ORDER — ONDANSETRON HCL 4 MG/2ML IJ SOLN
4.0000 mg | Freq: Once | INTRAMUSCULAR | Status: AC
  Administered 2024-03-15: 4 mg via INTRAVENOUS
  Filled 2024-03-15: qty 2

## 2024-03-15 NOTE — H&P (Signed)
 History and Physical    Patient: Mike Keller FMW:[id number] DOB: [birth year] DOA: 03/15/2024 DOS: the patient was seen and examined on 03/16/2024 PCP: Bevely Doffing, FNP  Patient coming from: Home  Chief Complaint:  Chief Complaint  Patient presents with   Chest Pain   HPI: Mike Keller is a 58 y.o. male with medical history significant of hypertension, asthma who presents emergency department due to sudden onset of a nonreproducible, sharp stabbing left-sided chest pain with radiation to left shoulder and left arm which started about 2 hours PTA.  He was in bed resting when the pain started.  EMS was activated and on arrival of EMS team, 1 sublingual nitroglycerin  was provided and this caused a relief of the chest pain by the time he got to the ED, though patient still complain of minimal chest discomfort.  ED Course:  In the emergency department, he was hemodynamically stable.  Workup in the ED showed normocytic anemia, BMP showed sodium of 122, chloride 90, bicarb 18.  Alcohol 203, total CK3 85, troponin times was flat at 4. Chest x-ray showed normal study. IV morphine , Zofran  and IV NS 1 L was provided.  TRH was asked to admit patient.  Review of Systems: Review of systems as noted in the HPI. All other systems reviewed and are negative.   Past Medical History:  Diagnosis Date   Allergy    Asthma    as a child   Chronic back pain    Hypertension    Past Surgical History:  Procedure Laterality Date   HAND SURGERY     left   HAND SURGERY     HAND SURGERY Left     Social History:  reports that he has been smoking cigarettes. He started smoking about 30 years ago. He has a 30.5 pack-year smoking history. He has never used smokeless tobacco. He reports current alcohol use of about 7.0 standard drinks of alcohol per week. He reports that he does not currently use drugs after having used the following drugs: Marijuana.   Allergies  Allergen Reactions   Lisinopril  Swelling     Angioedema     Family History  Problem Relation Age of Onset   Asthma Mother    Hypertension Mother    Hypertension Father    Kidney disease Brother    Stroke Brother    Hypertension Brother      Prior to Admission medications   Medication Sig Start Date End Date Taking? Authorizing Provider  acetaminophen  (TYLENOL ) 500 MG tablet Take 1,000 mg by mouth every 6 (six) hours as needed for mild pain (pain score 1-3).   Yes [provider]  albuterol  (VENTOLIN  HFA) 108 (90 Base) MCG/ACT inhaler Inhale 2 puffs into the lungs every 4 (four) hours as needed for wheezing or shortness of breath. 08/23/23  Yes Melvenia Manus BRAVO, MD  amLODipine -olmesartan  (AZOR ) 10-40 MG tablet Take 1 tablet by mouth daily. 09/20/23  Yes Melvenia Manus BRAVO, MD  lidocaine  (LIDODERM ) 5 % Place 1 patch onto the skin daily. Remove & Discard patch within 12 hours or as directed by MD 03/11/24  Yes Suellen, Celeste A, PA-C  meloxicam  (MOBIC ) 7.5 MG tablet Take 1 tablet (7.5 mg total) by mouth daily. 03/11/24  Yes Beatty, Celeste A, PA-C  Vitamin D , Ergocalciferol , (DRISDOL ) 1.25 MG (50000 UNIT) CAPS capsule Take 1 capsule (50,000 Units total) by mouth every 7 (seven) days. Patient taking differently: Take 50,000 Units by mouth every Monday. 09/20/23  Yes Dixon,  Manus BRAVO, MD    Physical Exam: BP 130/84   Pulse 76   Temp 98.3 F (36.8 C) (Oral)   Resp 18   SpO2 94%   General: 58 y.o. year-old male well developed well nourished in no acute distress.  Alert and oriented x3. HEENT: NCAT, EOMI, dry mucous membrane Neck: Supple, trachea medial Cardiovascular: Regular rate and rhythm with no rubs or gallops.  No thyromegaly or JVD noted.  No lower extremity edema. 2/4 pulses in all 4 extremities. Respiratory: Clear to auscultation with no wheezes or rales. Good inspiratory effort. Abdomen: Soft, nontender nondistended with normal bowel sounds x4 quadrants. Muskuloskeletal: No cyanosis, clubbing or edema noted  bilaterally Neuro: CN II-XII intact, strength 5/5 x 4, sensation, reflexes intact Skin: No ulcerative lesions noted or rashes Psychiatry: Judgement and insight appear normal. Mood is appropriate for condition and setting          Labs on Admission:  Basic Metabolic Panel: Recent Labs  Lab 03/15/24 1926  NA 122*  K 4.6  CL 90*  CO2 18*  GLUCOSE 82  BUN 6  CREATININE 0.64  CALCIUM 8.5*   Liver Function Tests: No results for input(s): AST, ALT, ALKPHOS, BILITOT, PROT, ALBUMIN in the last 168 hours. No results for input(s): LIPASE, AMYLASE in the last 168 hours. No results for input(s): AMMONIA in the last 168 hours. CBC: Recent Labs  Lab 03/15/24 1926  WBC 6.1  HGB 12.1*  HCT 33.1*  MCV 95.4  PLT 164   Cardiac Enzymes: Recent Labs  Lab 03/15/24 2056  CKTOTAL 385    BNP (last 3 results) No results for input(s): BNP in the last 8760 hours.  ProBNP (last 3 results) No results for input(s): PROBNP in the last 8760 hours.  CBG: No results for input(s): GLUCAP in the last 168 hours.  Radiological Exams on Admission: DG Chest 2 View Result Date: 03/15/2024 CLINICAL DATA:  Chest pain EXAM: CHEST - 2 VIEW COMPARISON:  11/23/2021 FINDINGS: The heart size and mediastinal contours are within normal limits. Both lungs are clear. The visualized skeletal structures are unremarkable. IMPRESSION: Normal study. Electronically Signed   By: Franky Crease M.D.   On: 03/15/2024 19:08    EKG: I independently viewed the EKG done and my findings are as followed: Normal sinus rhythm at a rate of 79 bpm  Assessment/Plan Present on Admission:  Hyponatremia  Essential hypertension  Asthma  Principal Problem:   Hyponatremia Active Problems:   Essential hypertension   Asthma   Atypical chest pain   Alcohol intoxication (HCC)  Hyponatremia Na 122 possibly due to dehydration Continue gentle hydration Continue to monitor sodium with serial BMPs Urine  osmolality, serum osmolality and urine sodium will be checked  Atypical Chest Pain Continue telemetry  Troponins x2 - 4 > 4 EKG showed normal sinus rhythm at a rate of 79 bpm Cardiology will be consulted to help decide if Stress test is needed in am Versus other  diagnostic modalities.    Continue aspirin  81 mg, nitroglycerin  prn  Alcohol intoxication Alcohol level 203 No alcohol withdrawal symptoms at this time Continue to monitor and consider starting patient on CIWA protocol for any withdrawal symptoms  Essential hypertension-controlled Continue Azor   Asthma (not in acute exacerbation) Continue albuterol  as needed  DVT prophylaxis: Lovenox   Code Status: Full code  Family Communication: None at bedside  Consults: Cardiology  Severity of Illness: The appropriate patient status for this patient is INPATIENT. Inpatient status is judged to be  reasonable and necessary in order to provide the required intensity of service to ensure the patient's safety. The patient's presenting symptoms, physical exam findings, and initial radiographic and laboratory data in the context of their chronic comorbidities is felt to place them at high risk for further clinical deterioration. Furthermore, it is not anticipated that the patient will be medically stable for discharge from the hospital within 2 midnights of admission.   * I certify that at the point of admission it is my clinical judgment that the patient will require inpatient hospital care spanning beyond 2 midnights from the point of admission due to high intensity of service, high risk for further deterioration and high frequency of surveillance required.*  Author: Keondrick Dilks, DO 03/16/2024 2:27 AM  For on call review [email or web address].

## 2024-03-15 NOTE — ED Provider Notes (Signed)
 [place] EMERGENCY DEPARTMENT AT [facility] Provider Note   CSN: [id number] Arrival date & time: 03/15/24  8167     Patient presents with: Chest Pain   Mike Keller is a 58 y.o. male.    Chest Pain Associated symptoms: no abdominal pain, no diaphoresis, no fever, no nausea, no shortness of breath, no vomiting and no weakness        Mike Keller is a 58 y.o. male with past medical history of asthma, hypertension, who presents to the Emergency Department complaining of sudden onset of sharp stabbing left lateral chest pain that began approximately 2 hours prior to arrival.  He states the pain radiated into his left arm and hand.  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  He contacted EMS was given 1 sublingual nitro patient denies any initial relief but upon arrival to the hospital, patient states his chest pain has improved although not completely subsided. States that he drinks alcohol and smokes marijuana.  Denies cocaine use.  Prior to Admission medications   Medication Sig Start Date End Date Taking? Authorizing Provider  acetaminophen  (TYLENOL ) 500 MG tablet Take 1,000 mg by mouth every 6 (six) hours as needed for mild pain (pain score 1-3).   Yes [provider]  albuterol  (VENTOLIN  HFA) 108 (90 Base) MCG/ACT inhaler Inhale 2 puffs into the lungs every 4 (four) hours as needed for wheezing or shortness of breath. 08/23/23  Yes Melvenia Manus BRAVO, MD  amLODipine -olmesartan  (AZOR ) 10-40 MG tablet Take 1 tablet by mouth daily. 09/20/23  Yes Melvenia Manus BRAVO, MD  lidocaine  (LIDODERM ) 5 % Place 1 patch onto the skin daily. Remove & Discard patch within 12 hours or as directed by MD 03/11/24  Yes Suellen, Celeste A, PA-C  meloxicam  (MOBIC ) 7.5 MG tablet Take 1 tablet (7.5 mg total) by mouth daily. 03/11/24  Yes Beatty, Celeste A, PA-C  Vitamin D , Ergocalciferol , (DRISDOL )  1.25 MG (50000 UNIT) CAPS capsule Take 1 capsule (50,000 Units total) by mouth every 7 (seven) days. Patient taking differently: Take 50,000 Units by mouth every Monday. 09/20/23  Yes Melvenia Manus BRAVO, MD    Allergies: Lisinopril     Review of Systems  Constitutional:  Negative for appetite change, diaphoresis and fever.  Respiratory:  Negative for shortness of breath.   Cardiovascular:  Positive for chest pain.  Gastrointestinal:  Negative for abdominal pain, diarrhea, nausea and vomiting.  Genitourinary:  Negative for decreased urine volume, dysuria and frequency.  Musculoskeletal:  Negative for arthralgias.  Neurological:  Negative for syncope and weakness.    Updated Vital Signs BP 122/83   Pulse 72   Temp 98 F (36.7 C) (Oral)   Resp (!) 21   SpO2 97%   Physical Exam Vitals and nursing note reviewed.  Constitutional:      General: He is not in acute distress.    Appearance: He is well-developed. He is not toxic-appearing.  HENT:     Mouth/Throat:     Mouth: Mucous membranes are moist.     Pharynx: Oropharynx is clear.  Eyes:     General: No scleral icterus. Cardiovascular:     Rate and Rhythm: Normal rate and regular rhythm.     Pulses: Normal pulses.  Pulmonary:     Effort: Pulmonary effort is normal.     Comments: Tender to palpation left lateral chest wall.  No soft tissue crepitus Chest:     Chest wall: Tenderness present.  Abdominal:     General: There is no distension.     Palpations: Abdomen is soft.     Tenderness: There is no abdominal tenderness.  Musculoskeletal:        General: Normal range of motion.     Right lower leg: No edema.     Left lower leg: No edema.  Skin:    General: Skin is warm.     Capillary Refill: Capillary refill takes less than 2 seconds.  Neurological:     General: No focal deficit present.     Mental Status: He is alert.     Sensory: No sensory deficit.     Motor: No weakness.     (all labs ordered are listed, but only  abnormal results are displayed) Labs Reviewed  BASIC METABOLIC PANEL WITH GFR - Abnormal; Notable for the following components:      Result Value   Sodium 122 (*)    Chloride 90 (*)    CO2 18 (*)    Calcium 8.5 (*)    All other components within normal limits  CBC - Abnormal; Notable for the following components:   RBC 3.47 (*)    Hemoglobin 12.1 (*)    HCT 33.1 (*)    MCH 34.9 (*)    MCHC 36.6 (*)    All other components within normal limits  ETHANOL - Abnormal; Notable for the following components:   Alcohol, Ethyl (B) 203 (*)    All other components within normal limits  CK  TROPONIN I (HIGH SENSITIVITY)  TROPONIN I (HIGH SENSITIVITY)    EKG: EKG Interpretation Date/Time:  Thursday March 15 2024 18:36:10 EDT Ventricular Rate:  79 PR Interval:  170 QRS Duration:  92 QT Interval:  364 QTC Calculation: 418 R Axis:   73  Text Interpretation: Sinus rhythm Since last tracing rate slower Confirmed by Cleotilde Rogue ([id number]) on 03/15/2024 6:59:44 PM  Radiology: [facility] Chest 2 View Result Date: 03/15/2024 CLINICAL DATA:  Chest pain EXAM: CHEST - 2 VIEW COMPARISON:  11/23/2021 FINDINGS: The heart size and mediastinal contours are within normal limits. Both lungs are clear. The visualized skeletal structures are unremarkable. IMPRESSION: Normal study. Electronically Signed   By: Franky Crease M.D.   On: 03/15/2024 19:08     Procedures   Medications Ordered in the ED  morphine  (PF) 4 MG/ML injection 4 mg (4 mg Intravenous Given 03/15/24 2004)  ondansetron  (ZOFRAN ) injection 4 mg (4 mg Intravenous Given 03/15/24 2003)  sodium chloride  0.9 % bolus 1,000 mL (1,000 mLs Intravenous New Bag/Given 03/15/24 2016)                                    Medical Decision Making Patient here with sudden onset sharp stabbing left-sided chest pain while at rest.  Pain radiated down his left arm.  Was given 1 sublingual nitroglycerin  and route by EMS without initial relief but patient states his chest  pain had improved upon ER arrival.  Admits to working in a [employer].  He is well appearing, does not appear to be in alcohol withdrawal  ACS, PE, MSK, rhabdo, electrolyte imbalance all considered  Amount and/or Complexity of Data Reviewed Labs: ordered.    Details: Labs show hyponatremia with sodium of 122.  Troponin reassuring.  Blood alcohol elevated.  No leukocytosis, total CK  reassuring. Radiology: ordered.    Details: Chest x-ray without acute cardiopulmonary finding ECG/medicine tests: ordered.    Details: EKG shows sinus rhythm Discussion of management or test interpretation with external provider(s): Workup this evening shows hyponatremia, given IV fluids here.  Chest pain improved upon ER arrival but not completely resolved.  Chest pain resolved after IV fluids and morphine  here.  Patient resting comfortably.  No clinical signs of acute alcohol withdrawal.  He denies chest pain at present.  Will admit for hyponatremia.  Discussed with [facility] hospitalist Dr. Manfred    Risk Prescription drug management. Decision regarding hospitalization.        Final diagnoses:  Hyponatremia    ED Discharge Orders     None          Herlinda Madelin RIGGERS 03/15/24 2258    Cleotilde Rogue, MD 03/16/24 [phone]

## 2024-03-15 NOTE — ED Triage Notes (Signed)
 Pt arrives via EMS from home with c/o CP radiating down L arm starting approx 1 hr ago. PT also c/o pain with inspiration. EMS report giving 1 Nitroglyceryn sublingual with no relief. Hx HTN, COPD.

## 2024-03-16 ENCOUNTER — Ambulatory Visit: Payer: Self-pay | Admitting: Orthopedic Surgery

## 2024-03-16 ENCOUNTER — Other Ambulatory Visit: Payer: Self-pay

## 2024-03-16 DIAGNOSIS — F10929 Alcohol use, unspecified with intoxication, unspecified: Secondary | ICD-10-CM | POA: Insufficient documentation

## 2024-03-16 DIAGNOSIS — R0789 Other chest pain: Secondary | ICD-10-CM | POA: Insufficient documentation

## 2024-03-16 LAB — BASIC METABOLIC PANEL WITH GFR
Anion gap: 14 (ref 5–15)
BUN: 7 mg/dL (ref 6–20)
CO2: 21 mmol/L — ABNORMAL LOW (ref 22–32)
Calcium: 8.6 mg/dL — ABNORMAL LOW (ref 8.9–10.3)
Chloride: 95 mmol/L — ABNORMAL LOW (ref 98–111)
Creatinine, Ser: 0.69 mg/dL (ref 0.61–1.24)
GFR, Estimated: >60 mL/min (ref >60)
Glucose, Bld: 74 mg/dL (ref 70–99)
Potassium: 4.7 mmol/L (ref 3.5–5.1)
Sodium: 130 mmol/L — ABNORMAL LOW (ref 135–145)

## 2024-03-16 LAB — PHOSPHORUS: Phosphorus: 3.6 mg/dL (ref 2.5–4.6)

## 2024-03-16 LAB — MRSA NEXT GEN BY PCR, NASAL: MRSA by PCR Next Gen: DETECTED — AB

## 2024-03-16 LAB — CBC
HCT: 35.7 % — ABNORMAL LOW (ref 39.0–52.0)
Hemoglobin: 12.9 g/dL — ABNORMAL LOW (ref 13.0–17.0)
MCH: 34.8 pg — ABNORMAL HIGH (ref 26.0–34.0)
MCHC: 36.1 g/dL — ABNORMAL HIGH (ref 30.0–36.0)
MCV: 96.2 fL (ref 80.0–100.0)
Platelets: 162 K/uL (ref 150–400)
RBC: 3.71 MIL/uL — ABNORMAL LOW (ref 4.22–5.81)
RDW: 12.6 % (ref 11.5–15.5)
WBC: 6.4 K/uL (ref 4.0–10.5)
nRBC: 0 % (ref 0.0–0.2)

## 2024-03-16 LAB — COMPREHENSIVE METABOLIC PANEL WITH GFR
ALT: 78 U/L — ABNORMAL HIGH (ref 0–44)
AST: 107 U/L — ABNORMAL HIGH (ref 15–41)
Albumin: 3.8 g/dL (ref 3.5–5.0)
Alkaline Phosphatase: 40 U/L (ref 38–126)
Anion gap: 13 (ref 5–15)
BUN: 5 mg/dL — ABNORMAL LOW (ref 6–20)
CO2: 19 mmol/L — ABNORMAL LOW (ref 22–32)
Calcium: 8.8 mg/dL — ABNORMAL LOW (ref 8.9–10.3)
Chloride: 98 mmol/L (ref 98–111)
Creatinine, Ser: 0.6 mg/dL — ABNORMAL LOW (ref 0.61–1.24)
GFR, Estimated: >60 mL/min (ref >60)
Glucose, Bld: 73 mg/dL (ref 70–99)
Potassium: 4.6 mmol/L (ref 3.5–5.1)
Sodium: 130 mmol/L — ABNORMAL LOW (ref 135–145)
Total Bilirubin: 1.2 mg/dL (ref 0.0–1.2)
Total Protein: 6.9 g/dL (ref 6.5–8.1)

## 2024-03-16 LAB — MAGNESIUM: Magnesium: 1.8 mg/dL (ref 1.7–2.4)

## 2024-03-16 LAB — HIV ANTIBODY (ROUTINE TESTING W REFLEX): HIV Screen 4th Generation wRfx: NONREACTIVE

## 2024-03-16 LAB — OSMOLALITY: Osmolality: 276 mosm/kg (ref 275–295)

## 2024-03-16 MED ORDER — ASPIRIN 81 MG PO TBEC: 81.0000 mg | DELAYED_RELEASE_TABLET | Freq: Every day | ORAL | 2 refills | Status: AC

## 2024-03-16 MED ORDER — AMLODIPINE-OLMESARTAN 10-40 MG PO TABS: 1.0000 | ORAL_TABLET | Freq: Every day | ORAL | Status: DC

## 2024-03-16 MED ORDER — ASPIRIN 81 MG PO TBEC: 81.0000 mg | DELAYED_RELEASE_TABLET | Freq: Every day | ORAL | Status: DC

## 2024-03-16 MED ORDER — ACETAMINOPHEN 650 MG RE SUPP: 650.0000 mg | Freq: Four times a day (QID) | RECTAL | Status: DC | PRN

## 2024-03-16 MED ORDER — ONDANSETRON HCL 4 MG PO TABS: 4.0000 mg | ORAL_TABLET | Freq: Four times a day (QID) | ORAL | Status: DC | PRN

## 2024-03-16 MED ORDER — MUPIROCIN 2 % EX OINT: TOPICAL_OINTMENT | Freq: Two times a day (BID) | CUTANEOUS | 0 refills | Status: AC

## 2024-03-16 MED ORDER — CHLORHEXIDINE GLUCONATE CLOTH 2 % EX PADS
6.0000 | MEDICATED_PAD | Freq: Every day | CUTANEOUS | Status: DC
  Administered 2024-03-16: 6 via TOPICAL

## 2024-03-16 MED ORDER — MUPIROCIN 2 % EX OINT
TOPICAL_OINTMENT | Freq: Two times a day (BID) | CUTANEOUS | Status: DC
  Administered 2024-03-16: 1 via NASAL
  Filled 2024-03-16: qty 22

## 2024-03-16 MED ORDER — FAMOTIDINE 40 MG PO TABS
40.0000 mg | ORAL_TABLET | Freq: Every evening | ORAL | 1 refills | Status: AC
Start: 2024-03-16 — End: 2025-03-16

## 2024-03-16 MED ORDER — NITROGLYCERIN 0.4 MG SL SUBL: 0.4000 mg | SUBLINGUAL_TABLET | SUBLINGUAL | Status: DC | PRN

## 2024-03-16 MED ORDER — IRBESARTAN 150 MG PO TABS
300.0000 mg | ORAL_TABLET | Freq: Every day | ORAL | Status: DC
  Administered 2024-03-16: 300 mg via ORAL
  Filled 2024-03-16: qty 2

## 2024-03-16 MED ORDER — NITROGLYCERIN 0.4 MG SL SUBL: 0.4000 mg | SUBLINGUAL_TABLET | SUBLINGUAL | 12 refills | Status: AC | PRN

## 2024-03-16 MED ORDER — ACETAMINOPHEN 325 MG PO TABS: 650.0000 mg | ORAL_TABLET | Freq: Four times a day (QID) | ORAL | Status: DC | PRN

## 2024-03-16 MED ORDER — ALBUTEROL SULFATE (2.5 MG/3ML) 0.083% IN NEBU: 3.0000 mL | INHALATION_SOLUTION | RESPIRATORY_TRACT | Status: DC | PRN

## 2024-03-16 MED ORDER — AMLODIPINE BESYLATE 5 MG PO TABS
10.0000 mg | ORAL_TABLET | Freq: Every day | ORAL | Status: DC
  Administered 2024-03-16: 10 mg via ORAL
  Filled 2024-03-16: qty 2

## 2024-03-16 MED ORDER — ENOXAPARIN SODIUM 40 MG/0.4ML IJ SOSY: 40.0000 mg | PREFILLED_SYRINGE | INTRAMUSCULAR | Status: DC

## 2024-03-16 MED ORDER — SODIUM CHLORIDE 0.9 % IV SOLN: INTRAVENOUS | Status: DC

## 2024-03-16 MED ORDER — ONDANSETRON HCL 4 MG/2ML IJ SOLN: 4.0000 mg | Freq: Four times a day (QID) | INTRAMUSCULAR | Status: DC | PRN

## 2024-03-16 MED ORDER — NALTREXONE HCL 50 MG PO TABS: 50.0000 mg | ORAL_TABLET | Freq: Every day | ORAL | 1 refills | Status: AC

## 2024-03-16 NOTE — Progress Notes (Signed)
 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 Or Actions Taken?: standing orders for bactroban  oint. BID placed

## 2024-03-16 NOTE — Hospital Course (Addendum)
 Mike Keller is a 58 y.o. male with medical history significant of hypertension, asthma who presents emergency department due to sudden onset of a nonreproducible, sharp stabbing left-sided chest pain with radiation to left shoulder and left arm which started about 2 hours PTA.  He was in bed resting when the pain started.  EMS was activated and on arrival of EMS team, 1 sublingual nitroglycerin  was provided and this caused a relief of the chest pain by the time he got to the ED, though patient still complain of minimal chest discomfort.   ED Course:  Hemodynamically stable.  Workup in the ED showed normocytic anemia, BMP showed sodium of 122, chloride 90, bicarb 18.  Alcohol 203, total CK3 85, troponin times was flat at 4. Chest x-ray showed normal study. IV morphine , Zofran  and IV NS 1 L was provided.  TRH was asked to admit patient.     Hyponatremia Na 122 >>> 130  possibly due to dehydration Status post IV fluid resuscitation -SIADH workup    Atypical Chest Pain -Resolved, troponin remained flat, no changes in EKG Troponins x2 - 4 > 4 EKG showed normal sinus rhythm at a rate of 79 bpm -Echocardiogram: Reviewed, within normal limits  -Cardiology consulted, no further workup    Continue aspirin  81 mg, nitroglycerin  prn Was prescribed   Alcohol intoxication Alcohol level 203 No alcohol withdrawal symptoms at this time -Was monitored patient started on naltrexone    Essential hypertension-controlled Continue Norvasc    Asthma (not in acute exacerbation) Continue albuterol  as needed

## 2024-03-16 NOTE — Consult Note (Signed)
 Cardiology Consultation   Patient ID: FOUAD TAUL MRN: [id number]; DOB: April 21, 1966  Admit date: 03/15/2024 Date of Consult: 03/16/2024  PCP:  Bevely Doffing, FNP   [facility] Providers Cardiologist:  Alvan Carrier, MD        Patient Profile: Mike Keller is a 58 y.o. male with a hx of HTN, asthma and pulmonary nodules who is being seen 03/16/2024 for the evaluation of chest pain at the request of Dr. Manfred.  History of Present Illness: Mike Keller presented to [facility] ED yesterday evening for evaluation of chest pain which started an hour prior to arrival. Was given nitroglycerin  by EMS with improvement in symptoms but had recurrence while in the ED. In talking with the patient and his mom today, he reports he is usually very active at baseline as he works at [employer] and walks around [employer] throughout the day. He denies any recent chest pain or dyspnea on exertion with this. Says that he is hot throughout the day as there is no air conditioning. He consumes a bottle of water during his 2 breaks but that is it. He does drink at least 3-4 beers on a daily basis. Smokes approximately 0.5 pack/day. Denies any recreational drug use. Says that his pain yesterday occurred while he was sitting still and was along his left pectoral region.  Symptoms lasted for over 1 hour prior to resolving. He received Morphine  and Zofran  while in the ED and pain resolved at that time. Pain was not associated with exertion or positional changes. He denies any dyspnea exertion, orthopnea, PND or pitting edema. Denies any recurrent pain overnight or this morning and is anxious to go home.  Initial labs showed WBC 6.1, Hgb 12.1, platelets 164, Na+ 122, K+ 4.6 and creatinine 0.64. Initial and repeat Hs Troponin values negative at 4. Ethanol level elevated at 203. CXR with no acute cardiopulmonary abnormalities. EKG shows NSR, HR 79 with no acute ST changes.   He received 1 L fluid bolus and has been  started on continuous IV fluids. Repeat labs today show his sodium is improving, now at 130. LFTs elevated with AST 107 and ALT 78.  Past Medical History:  Diagnosis Date   Allergy    Asthma    as a child   Chronic back pain    Hypertension     Past Surgical History:  Procedure Laterality Date   HAND SURGERY     left   HAND SURGERY     HAND SURGERY Left      Home Medications:  Prior to Admission medications   Medication Sig Start Date End Date Taking? Authorizing Provider  acetaminophen  (TYLENOL ) 500 MG tablet Take 1,000 mg by mouth every 6 (six) hours as needed for mild pain (pain score 1-3).   Yes [provider]  albuterol  (VENTOLIN  HFA) 108 (90 Base) MCG/ACT inhaler Inhale 2 puffs into the lungs every 4 (four) hours as needed for wheezing or shortness of breath. 08/23/23  Yes Melvenia Manus BRAVO, MD  amLODipine -olmesartan  (AZOR ) 10-40 MG tablet Take 1 tablet by mouth daily. 09/20/23  Yes Melvenia Manus BRAVO, MD  lidocaine  (LIDODERM ) 5 % Place 1 patch onto the skin daily. Remove & Discard patch within 12 hours or as directed by MD 03/11/24  Yes Suellen, Celeste A, PA-C  meloxicam  (MOBIC ) 7.5 MG tablet Take 1 tablet (7.5 mg total) by mouth daily. 03/11/24  Yes Beatty, Celeste A, PA-C  Vitamin D , Ergocalciferol , (DRISDOL ) 1.25 MG (50000 UNIT)  CAPS capsule Take 1 capsule (50,000 Units total) by mouth every 7 (seven) days. Patient taking differently: Take 50,000 Units by mouth every Monday. 09/20/23  Yes Melvenia Manus BRAVO, MD    Scheduled Meds:  amLODipine   10 mg Oral Daily   Chlorhexidine  Gluconate Cloth  6 each Topical Q0600   irbesartan   300 mg Oral Daily   Continuous Infusions:  sodium chloride  100 mL/hr at 03/16/24 0814   PRN Meds: acetaminophen  **OR** acetaminophen , albuterol , nitroGLYCERIN , ondansetron  **OR** ondansetron  (ZOFRAN ) IV  Allergies:    Allergies  Allergen Reactions   Lisinopril  Swelling    Angioedema     Social History:   Social History    Socioeconomic History   Marital status: Single    Spouse name: Not on file   Number of children: 0   Years of education: 11   Highest education level: Not on file  Occupational History   Occupation: Administrator, Civil Service, forklift  Tobacco Use   Smoking status: Every Day    Current packs/day: 1.00    Average packs/day: 1 pack/day for 30.5 years (30.5 ttl pk-yrs)    Types: Cigarettes    Start date: 09/06/1993   Smokeless tobacco: Never  Vaping Use   Vaping status: Never Used  Substance and Sexual Activity   Alcohol use: Yes    Alcohol/week: 7.0 standard drinks of alcohol    Types: 7 Cans of beer per week    Comment: beer, 1 cans daily   Drug use: Not Currently    Types: Marijuana    Comment: none since may 2022.   Sexual activity: Yes    Birth control/protection: None  Other Topics Concern   Not on file  Social History Narrative   Lives with girlfriend   Social Drivers of Health   Financial Resource Strain: Not on file  Food Insecurity: No Food Insecurity (03/16/2024)   Hunger Vital Sign    Worried About Running Out of Food in the Last Year: Never true    Ran Out of Food in the Last Year: Never true  Transportation Needs: No Transportation Needs (03/16/2024)   PRAPARE - Administrator, Civil Service (Medical): No    Lack of Transportation (Non-Medical): No  Physical Activity: Not on file  Stress: Not on file  Social Connections: Moderately Isolated (03/16/2024)   Social Connection and Isolation Panel    Frequency of Communication with Friends and Family: More than three times a week    Frequency of Social Gatherings with Friends and Family: Three times a week    Attends Religious Services: 1 to 4 times per year    Active Member of Clubs or Organizations: Not on file    Attends Club or Organization Meetings: Never    Marital Status: Never married  Intimate Partner Violence: Not At Risk (03/16/2024)   Humiliation, Afraid, Rape, and Kick questionnaire    Fear of  Current or Ex-Partner: No    Emotionally Abused: No    Physically Abused: No    Sexually Abused: No    Family History:    Family History  Problem Relation Age of Onset   Asthma Mother    Hypertension Mother    Hypertension Father    Kidney disease Brother    Stroke Brother    Hypertension Brother      ROS:  Please see the history of present illness.   All other ROS reviewed and negative.     Physical Exam/Data: Vitals:   03/16/24 0432 03/16/24  0700 03/16/24 0728 03/16/24 0900  BP: (!) 180/90 (!) 154/82  (!) 144/92  Pulse: 84 88  86  Resp: 18 20  19   Temp: 97.9 F (36.6 C)  98.3 F (36.8 C)   TempSrc: Oral     SpO2: 94% 95%  96%  Weight: 56.2 kg     Height: 5' 7 (1.702 m)       Intake/Output Summary (Last 24 hours) at 03/16/2024 0940 Last data filed at 03/16/2024 0742 Gross per 24 hour  Intake 269 ml  Output --  Net 269 ml      03/16/2024    4:32 AM 03/11/2024    6:50 PM 03/05/2024    7:50 PM  Last 3 Weights  Weight (lbs) 123 lb 14.4 oz 132 lb 133 lb  Weight (kg) 56.2 kg 59.875 kg 60.328 kg     Body mass index is 19.41 kg/m.  General:  Well nourished, well developed male appearing in no acute distress HEENT: normal Neck: no JVD Vascular: No carotid bruits; Distal pulses 2+ bilaterally Cardiac:  normal S1, S2; RRR; no murmur  Lungs:  clear to auscultation bilaterally, no wheezing, rhonchi or rales  Abd: soft, nontender, no hepatomegaly  Ext: no edema Musculoskeletal:  No deformities, BUE and BLE strength normal and equal Skin: warm and dry  Neuro:  CNs 2-12 intact, no focal abnormalities noted Psych:  Normal affect   EKG:  The EKG was personally reviewed and demonstrates:  NSR, HR 79 with no acute ST changes.  Telemetry:  Telemetry was personally reviewed and demonstrates:  NSR, HR in 70's to 80's.   Relevant CV Studies:  Echocardiogram: Pending  Laboratory Data: High Sensitivity Troponin:   Recent Labs  Lab 03/15/24 1926 03/15/24 2056   TROPONINIHS 4 4     Chemistry Recent Labs  Lab 03/15/24 1926 03/16/24 0349  NA 122* 130*  K 4.6 4.6  CL 90* 98  CO2 18* 19*  GLUCOSE 82 73  BUN 6 5*  CREATININE 0.64 0.60*  CALCIUM 8.5* 8.8*  MG  --  1.8  GFRNONAA >60 >60  ANIONGAP 14 13    Recent Labs  Lab 03/16/24 0349  PROT 6.9  ALBUMIN 3.8  AST 107*  ALT 78*  ALKPHOS 40  BILITOT 1.2   Lipids No results for input(s): CHOL, TRIG, HDL, LABVLDL, LDLCALC, CHOLHDL in the last 168 hours.  Hematology Recent Labs  Lab 03/15/24 1926 03/16/24 0349  WBC 6.1 6.4  RBC 3.47* 3.71*  HGB 12.1* 12.9*  HCT 33.1* 35.7*  MCV 95.4 96.2  MCH 34.9* 34.8*  MCHC 36.6* 36.1*  RDW 12.4 12.6  PLT 164 162   Thyroid No results for input(s): TSH, FREET4 in the last 168 hours.  BNPNo results for input(s): BNP, PROBNP in the last 168 hours.  DDimer No results for input(s): DDIMER in the last 168 hours.  Radiology/Studies:  DG Chest 2 View Result Date: 03/15/2024 CLINICAL DATA:  Chest pain EXAM: CHEST - 2 VIEW COMPARISON:  11/23/2021 FINDINGS: The heart size and mediastinal contours are within normal limits. Both lungs are clear. The visualized skeletal structures are unremarkable. IMPRESSION: Normal study. Electronically Signed   By: Franky Crease M.D.   On: 03/15/2024 19:08     Assessment and Plan:  1. Atypical Chest Pain - His chest pain seems atypical for a cardiac etiology as it occurred at rest and lasted for over an hour. No association with exertion. Resolved with Morphine  and Zofran . No recurrent pain overnight  or this morning.  - Hs Troponin values have been negative and EKG without acute ST changes. Will order an echocardiogram to assess for any structural abnormalities. If reassuring, would not anticipate further cardiac testing this admission.   2. HTN - BP is at 144/92 on most recent check. He is on Amlodipine -Olmesartan  10-40 mg daily. The hospital equivalent on formulary has been ordered. He does  have a history of angioedema with ACE-I in the past but has been tolerating ARB as an outpatient without issues.   3. Alcohol Use - Reports consuming at least 3-4 beers daily. Ethanol was at 203 on admission. LFT's elevated this AM and likely due to alcohol use. Reduction in alcohol intake advised. No signs of withdrawal at this time.   4. Hyponatremia - Na+ was at 122 on admission, improving to 130 today with IV fluids. We reviewed the importance of increasing his fluid intake at home. Management per the admitting team.   5. Tobacco Use - He reports smoking 0.5 ppd. Cessation advised.   For questions or updates, please contact [facility] Please consult [email or web address] for contact info under    Signed, Laymon CHRISTELLA Qua, PA-C  03/16/2024 9:40 AM

## 2024-03-16 NOTE — TOC CM/SW Note (Signed)
 Transition of Care [facility]) - Inpatient Brief Assessment   Patient Details  Name: Mike Keller MRN: [id number] Date of Birth: September 27, 1965  Transition of Care [facility]) CM/SW Contact:    Noreen KATHEE Pinal, LCSWA Phone Number: 03/16/2024, 10:35 AM   Clinical Narrative:  Transition of Care Department [facility]) has reviewed patient and no TOC needs have been identified at this time. We will continue to monitor patient advancement through interdisciplinary progression rounds. If new patient transition needs arise, please place a TOC consult.  Transition of Care Asessment: Insurance and Status: Selfpay Patient has primary care physician: Yes Home environment has been reviewed: Single Family Home Prior level of function:: Independent Prior/Current Home Services: No current home services Social Drivers of Health Review: SDOH reviewed interventions complete Readmission risk has been reviewed: Yes Transition of care needs: no transition of care needs at this time

## 2024-03-16 NOTE — Progress Notes (Addendum)
 Patient asked to speak with writer and Dr Willette relating to wanting to leave/be discharged. Patient was supposed to be discharged today after echo was completed, however echo did not come to patient room. Writer looked all around hospital and called echo number multiple times with no answer. AC made aware and asked to assist with locating echo tech with no success. Patient wanting to leave, writer attempted to educate patient, verbal communicate the importance of the echo getting done with no positive result and patient stating he wants to leave even with echo not being done. Dr Willette made aware and called to speak with patient over phone.  Dr Willette reluctantly agreeable to discharge patient since patient was insistent to leave even without echo being done anyway. IV removed with no complications. Went over discharge summary, follow up information and medication education with patient. All questions answered and patient expressed full understanding to follow up with PCP to get echo done as outpatient. Patient discharged with all belongings for home via car.

## 2024-03-16 NOTE — Discharge Summary (Signed)
 Physician Discharge Summary   Patient: Mike Keller MRN: [id number] DOB: May 12, 1966  Admit date:     03/15/2024  Discharge date: 03/16/24  Discharge Physician: Adriana DELENA Grams   PCP: Bevely Doffing, FNP   Recommendations at discharge:  Follow-up with PCP in 2-4 weeks Follow-up with cardiology in 2-6 weeks -May need outpatient further workup including stress test - Continue current medications - Avoid alcohol and alcohol products  Discharge Diagnoses: Principal Problem:   Hyponatremia Active Problems:   Essential hypertension   Asthma   Atypical chest pain   Alcohol intoxication (HCC)  Resolved Problems:   * No resolved hospital problems. *  Hospital Course: Mike Keller is a 58 y.o. male with medical history significant of hypertension, asthma who presents emergency department due to sudden onset of a nonreproducible, sharp stabbing left-sided chest pain with radiation to left shoulder and left arm which started about 2 hours PTA.  He was in bed resting when the pain started.  EMS was activated and on arrival of EMS team, 1 sublingual nitroglycerin  was provided and this caused a relief of the chest pain by the time he got to the ED, though patient still complain of minimal chest discomfort.   ED Course:  Hemodynamically stable.  Workup in the ED showed normocytic anemia, BMP showed sodium of 122, chloride 90, bicarb 18.  Alcohol 203, total CK3 85, troponin times was flat at 4. Chest x-ray showed normal study. IV morphine , Zofran  and IV NS 1 L was provided.  TRH was asked to admit patient.     Hyponatremia Na 122 >>> 130  possibly due to dehydration Status post IV fluid resuscitation -SIADH workup    Atypical Chest Pain -Resolved, troponin remained flat, no changes in EKG Troponins x2 - 4 > 4 EKG showed normal sinus rhythm at a rate of 79 bpm -Echocardiogram: Reviewed, within normal limits  -Cardiology consulted, no further workup    Continue aspirin  81 mg,  nitroglycerin  prn Was prescribed   Alcohol intoxication Alcohol level 203 No alcohol withdrawal symptoms at this time -Was monitored patient started on naltrexone    Essential hypertension-controlled Continue Norvasc    Asthma (not in acute exacerbation) Continue albuterol  as needed  Patient is cleared to discharge home.    Disposition: Home Diet recommendation:  Discharge Diet Orders (From admission, onward)     Start     Ordered   03/16/24 0000  Diet general        03/16/24 1107           Regular diet DISCHARGE MEDICATION: Allergies as of 03/16/2024       Reactions   Lisinopril  Swelling   Angioedema        Medication List     STOP taking these medications    meloxicam  7.5 MG tablet Commonly known as: Mobic        TAKE these medications    acetaminophen  500 MG tablet Commonly known as: TYLENOL  Take 1,000 mg by mouth every 6 (six) hours as needed for mild pain (pain score 1-3).   albuterol  108 (90 Base) MCG/ACT inhaler Commonly known as: VENTOLIN  HFA Inhale 2 puffs into the lungs every 4 (four) hours as needed for wheezing or shortness of breath.   amLODipine -olmesartan  10-40 MG tablet Commonly known as: AZOR  Take 1 tablet by mouth daily.   aspirin  EC 81 MG tablet Take 1 tablet (81 mg total) by mouth daily. Swallow whole.   famotidine  40 MG tablet Commonly known as: PEPCID  Take 1  tablet (40 mg total) by mouth every evening.   lidocaine  5 % Commonly known as: Lidoderm  Place 1 patch onto the skin daily. Remove & Discard patch within 12 hours or as directed by MD   mupirocin  ointment 2 % Commonly known as: BACTROBAN  Place into the nose 2 (two) times daily.   naltrexone  50 MG tablet Commonly known as: DEPADE Take 1 tablet (50 mg total) by mouth daily.   nitroGLYCERIN  0.4 MG SL tablet Commonly known as: NITROSTAT  Place 1 tablet (0.4 mg total) under the tongue every 5 (five) minutes as needed for up to 3 days for chest pain.   Vitamin D   (Ergocalciferol ) 1.25 MG (50000 UNIT) Caps capsule Commonly known as: DRISDOL  Take 1 capsule (50,000 Units total) by mouth every 7 (seven) days. What changed: when to take this        Discharge Exam: Filed Weights   03/16/24 0432  Weight: 56.2 kg        General:  AAO x 3,  cooperative, no distress;   HEENT:  Normocephalic, PERRL, otherwise with in Normal limits   Neuro:  CNII-XII intact. , normal motor and sensation, reflexes intact   Lungs:   Clear to auscultation BL, Respirations unlabored,  No wheezes / crackles  Cardio:    S1/S2, RRR, No murmure, No Rubs or Gallops   Abdomen:  Soft, non-tender, bowel sounds active all four quadrants, no guarding or peritoneal signs.  Muscular  skeletal:  Limited exam -global generalized weaknesses - in bed, able to move all 4 extremities,   2+ pulses,  symmetric, No pitting edema  Skin:  Dry, warm to touch, negative for any Rashes,  Wounds: Please see nursing documentation          Condition at discharge: good  The results of significant diagnostics from this hospitalization (including imaging, microbiology, ancillary and laboratory) are listed below for reference.   Imaging Studies: DG Chest 2 View Result Date: 03/15/2024 CLINICAL DATA:  Chest pain EXAM: CHEST - 2 VIEW COMPARISON:  11/23/2021 FINDINGS: The heart size and mediastinal contours are within normal limits. Both lungs are clear. The visualized skeletal structures are unremarkable. IMPRESSION: Normal study. Electronically Signed   By: Franky Crease M.D.   On: 03/15/2024 19:08   DG Knee Right Port Result Date: 03/05/2024 CLINICAL DATA:  Lateral knee pain with a lesional edema EXAM: PORTABLE RIGHT KNEE - 1-2 VIEW COMPARISON:  09/01/2018 FINDINGS: No evidence of fracture, dislocation, or joint effusion. No evidence of arthropathy or other focal bone abnormality. Soft tissues are unremarkable. IMPRESSION: Negative. Electronically Signed   By: Norman Gatlin M.D.   On:  03/05/2024 20:17    Microbiology: Results for orders placed or performed during the hospital encounter of 03/15/24  MRSA Next Gen by PCR, Nasal     Status: Abnormal   Collection Time: 03/16/24  4:42 AM   Specimen: Nasal Mucosa; Nasal Swab  Result Value Ref Range Status   MRSA by PCR Next Gen DETECTED (A) NOT DETECTED Final    Comment: RESULT CALLED TO, READ BACK BY AND VERIFIED WITH: J. COLLINS RN 03/16/24 @1015  BY J.WHITE (NOTE) The GeneXpert MRSA Assay (FDA approved for NASAL specimens only), is one component of a comprehensive MRSA colonization surveillance program. It is not intended to diagnose MRSA infection nor to guide or monitor treatment for MRSA infections. Test performance is not FDA approved in patients less than 26 years old. Performed at [facility], [address], [place], [place] [address]  Labs: CBC: Recent Labs  Lab 03/15/24 1926 03/16/24 0349  WBC 6.1 6.4  HGB 12.1* 12.9*  HCT 33.1* 35.7*  MCV 95.4 96.2  PLT 164 162   Basic Metabolic Panel: Recent Labs  Lab 03/15/24 1926 03/16/24 0349  NA 122* 130*  K 4.6 4.6  CL 90* 98  CO2 18* 19*  GLUCOSE 82 73  BUN 6 5*  CREATININE 0.64 0.60*  CALCIUM 8.5* 8.8*  MG  --  1.8  PHOS  --  3.6   Liver Function Tests: Recent Labs  Lab 03/16/24 0349  AST 107*  ALT 78*  ALKPHOS 40  BILITOT 1.2  PROT 6.9  ALBUMIN 3.8   CBG: No results for input(s): GLUCAP in the last 168 hours.  Discharge time spent: greater than 30 minutes.  Signed: Adriana DELENA Grams, MD [facility] 03/16/2024

## 2024-03-23 ENCOUNTER — Ambulatory Visit: Payer: Self-pay

## 2024-03-28 ENCOUNTER — Inpatient Hospital Stay: Payer: Self-pay

## 2024-04-11 ENCOUNTER — Other Ambulatory Visit: Payer: Self-pay | Admitting: Internal Medicine

## 2024-04-11 ENCOUNTER — Ambulatory Visit: Payer: Self-pay

## 2024-04-11 VITALS — BP 120/72 | HR 110 | Ht 66.0 in | Wt 126.1 lb

## 2024-04-11 DIAGNOSIS — Z720 Tobacco use: Secondary | ICD-10-CM

## 2024-04-11 DIAGNOSIS — I10 Essential (primary) hypertension: Secondary | ICD-10-CM

## 2024-04-11 DIAGNOSIS — E559 Vitamin D deficiency, unspecified: Secondary | ICD-10-CM

## 2024-04-11 DIAGNOSIS — E871 Hypo-osmolality and hyponatremia: Secondary | ICD-10-CM

## 2024-04-11 NOTE — Assessment & Plan Note (Signed)
 Hypertension is well-controlled with current dose of amlodipine -olmesartan  to 10-40 mg daily.  No medication changes made today.  No refills needed at this time.

## 2024-04-11 NOTE — Progress Notes (Signed)
 Established Patient Office Visit  Subjective   Patient ID: Mike Keller, male    DOB: 12-Jan-1966  Age: 58 y.o. MRN: [id number]  Chief Complaint  Patient presents with   Medical Management of Chronic Issues    Hospital follow up    HPI  Mike Keller is a 58 y.o. male with medical history significant of hypertension, asthma who presents emergency department due to sudden onset of a nonreproducible, sharp stabbing left-sided chest pain with radiation to left shoulder and left arm which started about 2 hours PTA.  He was in bed resting when the pain started.  EMS was activated and on arrival of EMS team, 1 sublingual nitroglycerin  was provided and this caused a relief of the chest pain by the time he got to the ED, though patient still complain of minimal chest discomfo   Hyponatremia Na 122 >>> 130  possibly due to dehydration Status post IV fluid resuscitation -SIADH workup   Atypical Chest Pain -Resolved, troponin remained flat, no changes in EKG Troponins x2 - 4 > 4 EKG showed normal sinus rhythm at a rate of 79 bpm -Echocardiogram: Reviewed, within normal limits -Cardiology consulted, no further workup    Continue aspirin  81 mg, nitroglycerin  prn Was prescribed   Alcohol intoxication Alcohol level 203 No alcohol withdrawal symptoms at this time -Was monitored patient started on naltrexone    Essential hypertension-controlled Continue Norvasc    Asthma (not in acute exacerbation) Continue albuterol  as needed  Patient Active Problem List   Diagnosis Date Noted   Atypical chest pain 03/16/2024   Alcohol intoxication (HCC) 03/16/2024   Hyponatremia 03/15/2024   Vitamin D  deficiency 09/20/2023   Asthma 08/23/2023   Need for Tdap vaccination 08/23/2023   Need for zoster vaccination 08/23/2023   Pulmonary nodule 03/16/2021   Tobacco abuse 04/20/2017   Angioedema due to angiotensin converting enzyme inhibitor (ACE-I)    Essential hypertension       ROS     Objective:     BP 120/72 (BP Location: Left Arm, Patient Position: Sitting, Cuff Size: Normal)   Pulse (!) 110   Ht 5' 6 (1.676 m)   Wt 126 lb 1.3 oz (57.2 kg)   SpO2 97%   BMI 20.35 kg/m  BP Readings from Last 3 Encounters:  04/11/24 120/72  03/16/24 138/82  03/11/24 119/74   Wt Readings from Last 3 Encounters:  04/11/24 126 lb 1.3 oz (57.2 kg)  03/16/24 123 lb 14.4 oz (56.2 kg)  03/11/24 132 lb (59.9 kg)      Physical Exam Vitals and nursing note reviewed.  Constitutional:      Appearance: Normal appearance.  HENT:     Head: Normocephalic.  Eyes:     Extraocular Movements: Extraocular movements intact.     Pupils: Pupils are equal, round, and reactive to light.  Cardiovascular:     Rate and Rhythm: Normal rate and regular rhythm.  Pulmonary:     Effort: Pulmonary effort is normal.     Breath sounds: Normal breath sounds.  Musculoskeletal:     Cervical back: Normal range of motion and neck supple.  Neurological:     Mental Status: He is alert and oriented to person, place, and time.  Psychiatric:        Mood and Affect: Mood normal.        Thought Content: Thought content normal.      No results found for any visits on 04/11/24.  Last CBC Lab Results  Component Value  Date   WBC 6.4 03/16/2024   HGB 12.9 (L) 03/16/2024   HCT 35.7 (L) 03/16/2024   MCV 96.2 03/16/2024   MCH 34.8 (H) 03/16/2024   RDW 12.6 03/16/2024   PLT 162 03/16/2024   Last metabolic panel Lab Results  Component Value Date   GLUCOSE 74 03/16/2024   NA 130 (L) 03/16/2024   K 4.7 03/16/2024   CL 95 (L) 03/16/2024   CO2 21 (L) 03/16/2024   BUN 7 03/16/2024   CREATININE 0.69 03/16/2024   GFRNONAA >60 03/16/2024   CALCIUM 8.6 (L) 03/16/2024   PHOS 3.6 03/16/2024   PROT 6.9 03/16/2024   ALBUMIN 3.8 03/16/2024   LABGLOB 2.7 08/23/2023   BILITOT 1.2 03/16/2024   ALKPHOS 40 03/16/2024   AST 107 (H) 03/16/2024   ALT 78 (H) 03/16/2024   ANIONGAP 14 03/16/2024   Last  lipids Lab Results  Component Value Date   CHOL 132 08/23/2023   HDL 93 08/23/2023   LDLCALC 26 08/23/2023   TRIG 61 08/23/2023   CHOLHDL 1.4 08/23/2023   Last hemoglobin A1c Lab Results  Component Value Date   HGBA1C 5.4 08/23/2023   Last thyroid functions Lab Results  Component Value Date   TSH 0.980 08/23/2023   Last vitamin D  Lab Results  Component Value Date   VD25OH 6.1 (L) 08/23/2023      The 10-year ASCVD risk score (Arnett DK, et al., 2019) is: 13.2%    Assessment & Plan:   Problem List Items Addressed This Visit       Cardiovascular and Mediastinum   Essential hypertension - Primary   Hypertension is well-controlled with current dose of amlodipine -olmesartan  to 10-40 mg daily.  No medication changes made today.  No refills needed at this time.        Other   Tobacco abuse   Currently smokes 0.5 packs/day of cigarettes and has been smoking since age 33.  He previously smoked 1 pack/day and gradually working towards cessation. -The patient was counseled on the dangers of tobacco use, and was advised to quit.  Reviewed strategies to maximize success, including removing cigarettes and smoking materials from environment, stress management, substitution of other forms of reinforcement, support of family/friends, and written materials. Smoking cessation instruction/counseling given: 3 minutes spent on smoking cessation counseling       Hyponatremia   This was found during recent hospitalization.  We will recheck BMP today      Relevant Orders   Basic Metabolic Panel (BMET)    Return in about 4 months (around 08/11/2024) for chronic follow-up with PCP.    Leita Longs, FNP

## 2024-04-11 NOTE — Assessment & Plan Note (Signed)
 Currently smokes 0.5 packs/day of cigarettes and has been smoking since age 58.  He previously smoked 1 pack/day and gradually working towards cessation. -The patient was counseled on the dangers of tobacco use, and was advised to quit.  Reviewed strategies to maximize success, including removing cigarettes and smoking materials from environment, stress management, substitution of other forms of reinforcement, support of family/friends, and written materials. Smoking cessation instruction/counseling given: 3 minutes spent on smoking cessation counseling

## 2024-04-11 NOTE — Assessment & Plan Note (Signed)
 This was found during recent hospitalization.  We will recheck BMP today

## 2024-04-12 LAB — BASIC METABOLIC PANEL WITH GFR
BUN/Creatinine Ratio: 9 (ref 9–20)
BUN: 7 mg/dL (ref 6–24)
CO2: 17 mmol/L — ABNORMAL LOW (ref 20–29)
Calcium: 8.8 mg/dL (ref 8.7–10.2)
Chloride: 106 mmol/L (ref 96–106)
Creatinine, Ser: 0.78 mg/dL (ref 0.76–1.27)
Glucose: 65 mg/dL — ABNORMAL LOW (ref 70–99)
Potassium: 4.6 mmol/L (ref 3.5–5.2)
Sodium: 141 mmol/L (ref 134–144)
eGFR: 104 mL/min/1.73 (ref >59)

## 2024-04-24 ENCOUNTER — Other Ambulatory Visit (HOSPITAL_BASED_OUTPATIENT_CLINIC_OR_DEPARTMENT_OTHER): Payer: Self-pay

## 2024-05-23 ENCOUNTER — Emergency Department (HOSPITAL_COMMUNITY): Payer: Self-pay

## 2024-05-23 ENCOUNTER — Emergency Department (HOSPITAL_COMMUNITY)
Admission: EM | Admit: 2024-05-23 | Discharge: 2024-05-24 | Disposition: A | Discharge status: Home / Self Care | Payer: Self-pay | Attending: Emergency Medicine | Admitting: Emergency Medicine

## 2024-05-23 ENCOUNTER — Other Ambulatory Visit: Payer: Self-pay

## 2024-05-23 DIAGNOSIS — R079 Chest pain, unspecified: Secondary | ICD-10-CM | POA: Insufficient documentation

## 2024-05-23 DIAGNOSIS — M25561 Pain in right knee: Secondary | ICD-10-CM | POA: Insufficient documentation

## 2024-05-23 NOTE — ED Triage Notes (Signed)
 Pt reports concern for recurrent right knee pain with swelling x 2 weeks ago. Sts area has needed drainage in the past. No fevers. No injury/trauma.

## 2024-05-24 MED ORDER — CELECOXIB 200 MG PO CAPS: 200.0000 mg | ORAL_CAPSULE | Freq: Two times a day (BID) | ORAL | 0 refills | Status: AC

## 2024-05-24 MED ORDER — KETOROLAC TROMETHAMINE 15 MG/ML IJ SOLN
30.0000 mg | Freq: Once | INTRAMUSCULAR | Status: AC
  Administered 2024-05-24: 30 mg via INTRAMUSCULAR
  Filled 2024-05-24: qty 2

## 2024-05-24 NOTE — ED Provider Notes (Signed)
 [place] EMERGENCY DEPARTMENT AT [facility] Provider Note   CSN: [id number] Arrival date & time: 05/23/24  2011     History Chief Complaint  Patient presents with   Knee Pain    R    HPI Mike Keller is a 58 y.o. male presenting for chief complaint of right knee pain and swelling. 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  Denies fevers chills nausea vomiting syncope shortness of breath.  He is otherwise ambulatory tolerating p.o. intake.  Patient's recorded medical, surgical, social, medication list and allergies were reviewed in the Snapshot window as part of the initial history.   Review of Systems   Review of Systems  Constitutional:  Negative for chills and fever.  HENT:  Negative for ear pain and sore throat.   Eyes:  Negative for pain and visual disturbance.  Respiratory:  Negative for cough and shortness of breath.   Cardiovascular:  Negative for chest pain and palpitations.  Gastrointestinal:  Negative for abdominal pain and vomiting.  Genitourinary:  Negative for dysuria and hematuria.  Musculoskeletal:  Negative for arthralgias and back pain.  Skin:  Negative for color change and rash.  Neurological:  Negative for seizures and syncope.  All other systems reviewed and are negative.   Physical Exam Updated Vital Signs BP 128/84 (BP Location: Right Arm)   Pulse 86   Temp 98.4 F (36.9 C) (Oral)   Resp 18   SpO2 99%  Physical Exam Vitals and nursing note reviewed.  Constitutional:      General: He is not in acute distress.    Appearance: He is well-developed.  HENT:     Head: Normocephalic and atraumatic.  Eyes:     Conjunctiva/sclera: Conjunctivae normal.  Cardiovascular:     Rate and Rhythm: Normal rate and regular rhythm.     Heart sounds: No murmur heard. Pulmonary:     Effort: Pulmonary effort is normal. No respiratory distress.     Breath sounds: Normal breath  sounds.  Abdominal:     Palpations: Abdomen is soft.     Tenderness: There is no abdominal tenderness.  Musculoskeletal:        General: Swelling and tenderness present.     Cervical back: Neck supple.  Skin:    General: Skin is warm and dry.     Capillary Refill: Capillary refill takes less than 2 seconds.  Neurological:     Mental Status: He is alert.  Psychiatric:        Mood and Affect: Mood normal.      ED Course/ Medical Decision Making/ A&P    Procedures Procedures   Medications Ordered in ED Medications  ketorolac  (TORADOL ) 15 MG/ML injection 30 mg (30 mg Intramuscular Given 05/24/24 0029)    Medical Decision Making:   57 year old male with acute on chronic right knee pain.  Seen recently, underwent arthrocentesis, diagnosis bursitis.  Reviewed these labs. Likely overuse injury.  Discussed this with the patient who is in agreement.  Unlikely to provide any substantial benefit with a repeat arthrocentesis and may impose risk of infection.  He has no systemic injury or illness consistent with septic arthritis. He is tolerating ambulation though with mild difficulty. Discussed supportive care, medication to assist with symptoms and importance of following up with orthopedics for long-term management patient expressed understanding. X-ray performed with no new fracture or deformity.  Patient feels comfortable with  outpatient care management. Clinical Impression:  1. Acute pain of right knee   2. Chest pain, unspecified type      Discharge   Final Clinical Impression(s) / ED Diagnoses Final diagnoses:  Acute pain of right knee  Chest pain, unspecified type    Rx / DC Orders ED Discharge Orders          Ordered    Ambulatory referral to Cardiology        05/24/24 0006    celecoxib  (CELEBREX ) 200 MG capsule  2 times daily        05/24/24 0016              Jerral Meth, MD 05/24/24 [id number]

## 2024-06-03 ENCOUNTER — Ambulatory Visit: Payer: Self-pay

## 2024-06-05 ENCOUNTER — Telehealth: Payer: Self-pay

## 2024-06-05 NOTE — Telephone Encounter (Signed)
 Copied from CRM [id number]. Topic: Clinical - Lab/Test Results >> Jun 05, 2024  1:38 PM Lauren C wrote: Reason for CRM: FYI- Pt returning call on lab results. Results relayed. No further questions.

## 2024-06-05 NOTE — Telephone Encounter (Signed)
 Called to let th pt know that His labs showed normal kidney function and electrolytes

## 2024-06-05 NOTE — Telephone Encounter (Signed)
 Noted

## 2024-06-06 NOTE — Congregational Nurse Program (Signed)
  Dept: [phone]   Congregational Nurse Program Note  Date of Encounter: 06/06/2024  Past Medical History: Past Medical History:  Diagnosis Date   Allergy    Asthma    as a child   Chronic back pain    Hypertension     Encounter Details: Completed a hospital f/u call as a result of the pt being seen on 9.22.25 per ER visit    Successfully spoke with patient and he states he is doing ok but is still have intermittent chest pain and knee issues   Overall states he has had issues with obtaining his medications(aspirin , celebrex  (some left) famotidine , albuterol , vitaminsd (50,000 units) that were prescribed at from his ER visits and have not yet picked them up from [facility] [place] have not made his Cardiology appt yet due to concerns of not being able to get to the appointment because of distance and transportation issues   Plan -Reviewed with patient importance of staying connected with the his PCP at [facility] PCP when SICK VISIS arise and to avoid waiting until the problem get to worse  -Pt was re-educated on the use of Urgent vs Emergency room in the event his PCP office is not available or open for business.      -Advised pt that we will need additional documents  (IRS transcript, 401K statement and Sept bank statement) to successfully complete the CAFA process, reminding him that this will be able to assist with his upcoming specialty referral/appointments, medical bills etc  -Advised pt to contact the Cardiology specialists as directed to obtain an appt time and thereafter contact  Care Connect with the date to assist with setting up transportation on his behalf  -Advised pt as far as his knee to contact his Orthopaedic for when ever having knee issues in the future  Advised that by taking these above steps could avoid going to the ER and can assist him better with ongoing management of medical issues that may seem bother him often

## 2024-08-14 ENCOUNTER — Ambulatory Visit: Payer: Self-pay

## 2024-08-22 ENCOUNTER — Encounter: Payer: Self-pay | Admitting: Emergency Medicine

## 2024-08-23 ENCOUNTER — Telehealth: Payer: Self-pay | Admitting: *Deleted

## 2024-08-23 ENCOUNTER — Other Ambulatory Visit: Payer: Self-pay | Admitting: *Deleted

## 2024-08-23 DIAGNOSIS — Z87891 Personal history of nicotine dependence: Secondary | ICD-10-CM

## 2024-08-23 DIAGNOSIS — F17210 Nicotine dependence, cigarettes, uncomplicated: Secondary | ICD-10-CM

## 2024-08-23 DIAGNOSIS — Z122 Encounter for screening for malignant neoplasm of respiratory organs: Secondary | ICD-10-CM

## 2024-08-23 NOTE — Telephone Encounter (Signed)
 Lung Cancer Screening Narrative/Criteria Questionnaire (Cigarette Smokers Only- No Cigars/Pipes/vapes)   Mike Keller   SDMV:09/03/24 9:00- [place]                                           July 30, 1966              LDCT: 09/12/24 9:30 AP    58 y.o.   Phone: [phone]  Lung Screening Narrative (confirm age 22-77 yrs Medicare / 50-80 yrs Private pay insurance)   Insurance information: None   Referring Provider:[facility]   This screening involves an initial phone call with a team member from our program. It is called a shared decision making visit. The initial meeting is required by insurance and Medicare to make sure you understand the program. This appointment takes about 15-20 minutes to complete. The CT scan will completed at a separate date/time. This scan takes about 5-10 minutes to complete and you may eat and drink before and after the scan.  Criteria questions for Lung Cancer Screening:   Are you a current or former smoker? Current Age began smoking: 22   If you are a former smoker, what year did you quit smoking? (within 15 yrs)   To calculate your smoking history, I need an accurate estimate of how many packs of cigarettes you smoked per day and for how many years. (Not just the number of PPD you are now smoking)   Years smoking 36 x Packs per day 1/2 - 1 = Pack years 27   (at least 20 pack yrs)   (Make sure they understand that we need to know how much they have smoked in the past, not just the number of PPD they are smoking now)  Do you have a personal history of cancer?  No    Do you have a family history of cancer? Yes  (cancer type and and relative) Brother (throat)  Are you coughing up blood?  No  Have you had unexplained weight loss of 15 lbs or more in the last 6 months? No  It looks like you meet all criteria.     Additional information: N/A

## 2024-09-03 ENCOUNTER — Ambulatory Visit: Payer: Self-pay | Admitting: Acute Care

## 2024-09-03 DIAGNOSIS — F17210 Nicotine dependence, cigarettes, uncomplicated: Secondary | ICD-10-CM

## 2024-09-03 DIAGNOSIS — Z122 Encounter for screening for malignant neoplasm of respiratory organs: Secondary | ICD-10-CM

## 2024-09-03 DIAGNOSIS — Z720 Tobacco use: Secondary | ICD-10-CM

## 2024-09-03 NOTE — Patient Instructions (Addendum)
 Thank you for participating in the [facility]. It was our pleasure to meet you today. We will call you with the results of your scan within the next few days. Your scan will be assigned a Lung RADS category score by the physicians reading the scans.  This Lung RADS score determines follow up scanning.  See below for description of categories, and follow up screening recommendations. We will be in touch to schedule your follow up screening annually or based on recommendations of our providers. We will fax a copy of your scan results to your Primary Care Physician, or the physician who referred you to the program, to ensure they have the results. Please call the office if you have any questions or concerns regarding your scanning experience or results.  Our office number is [phone]. Please speak with Karna Curly, RN., Karna Doom RN, or [facility] RN, and Isaiah Dover RN. They are  our Lung Cancer Screening RN.'s If They are unavailable when you call, Please leave a message on the voice mail. We will return your call at our earliest convenience.This voice mail is monitored several times a day.  Remember, if your scan is normal, we will scan you annually as long as you continue to meet the criteria for the program. (Age 46-80, Current smoker or smoker who has quit within the last 15 years). If you are a smoker, remember, quitting is the single most powerful action that you can take to decrease your risk of lung cancer and other pulmonary, breathing related problems. We know quitting is hard, and we are here to help.  Please let us  know if there is anything we can do to help you meet your goal of quitting. If you are a former smoker, counselling psychologist. We are proud of you! Remain smoke free! Remember you can refer friends or family members through the number above.  We will screen them to make sure they meet criteria for the program. Thank you for helping us   take better care of you by participating in Lung Screening.  For Virtual Smoking Cessation Classes , The American Lung Association Provides  Freedom From Smoking Classes.  Please search their website for dates and times.    Lung RADS Categories:  Lung RADS 1: no nodules or definitely non-concerning nodules.  Recommendation is for a repeat annual scan in 12 months.  Lung RADS 2:  nodules that are non-concerning in appearance and behavior with a very low likelihood of becoming an active cancer. Recommendation is for a repeat annual scan in 12 months.  Lung RADS 3: nodules that are probably non-concerning , includes nodules with a low likelihood of becoming an active cancer.  Recommendation is for a 78-month repeat screening scan. Often noted after an upper respiratory illness. We will be in touch to make sure you have no questions, and to schedule your 38-month scan.  Lung RADS 4 A: nodules with concerning findings, recommendation is most often for a follow up scan in 3 months or additional testing based on our provider's assessment of the scan. We will be in touch to make sure you have no questions and to schedule the recommended 3 month follow up scan.  Lung RADS 4 B:  indicates findings that are concerning. We will be in touch with you to schedule additional diagnostic testing based on our provider's  assessment of the scan.  You can receive free nicotine  replacement therapy ( patches, gum or mints) by calling 1-800-QUIT NOW.  Please call so we can get you on the path to becoming  a non-smoker. I know it is hard, but you can do this!  Other options for assistance in smoking cessation ( As covered by your insurance benefits)  Hypnosis for smoking cessation  [employer] [phone]  Acupuncture for smoking cessation  [facility] [phone]    Freedom From Smoking  Virtual Group, FREE to [place] residents  (Class sizes are capped at 16 people) January  7th-February18th Wednesdays 6:15 pm- 7:45 pm  [email or web address] Contact:   Kimetha Fulwood   [email or web address]  [phone]   [email or web address]: [email or web address]  Your CT scan is scheduled for 09/12/2024 at 9:30 am at [facility].

## 2024-09-03 NOTE — Progress Notes (Signed)
 Virtual Visit via Telephone Note  I connected with Mike Keller on 09/03/2024 at  9:00 AM EST by telephone and verified that I am speaking with the correct person using two identifiers.  Location: Patient: At home, in [place]  Provider: [address], [place], [place], [address]    I discussed the limitations, risks, security and privacy concerns of performing an evaluation and management service by telephone and the availability of in person appointments. I also discussed with the patient that there may be a patient responsible charge related to this service. The patient expressed understanding and agreed to proceed.  Shared Decision Making Visit Lung Cancer Screening Program [phone])   Eligibility: Age 58 y.o. Pack Years Smoking History Calculation 27 pack years  (# packs/per year x # years smoked) Recent History of coughing up blood  no Unexplained weight loss? no ( >Than 15 pounds within the last 6 months ) Prior History Lung / other cancer no (Diagnosis within the last 5 years already requiring surveillance chest CT Scans). Smoking Status Current Smoker Former Smokers: Years since quit: N/A  Quit Date: N/A  Visit Components: Discussion included one or more decision making aids. yes Discussion included risk/benefits of screening. yes Discussion included potential follow up diagnostic testing for abnormal scans. yes Discussion included meaning and risk of over diagnosis. yes Discussion included meaning and risk of False Positives. yes Discussion included meaning of total radiation exposure. yes  Counseling Included: Importance of adherence to annual lung cancer LDCT screening. yes Impact of comorbidities on ability to participate in the program. yes Ability and willingness to under diagnostic treatment. yes  Smoking Cessation Counseling: Current Smokers:  Discussed importance of smoking cessation. yes Information about tobacco cessation classes and interventions  provided to patient. yes Patient provided with ticket for LDCT Scan. N/A Symptomatic Patient. yes  Counseling(Intermediate counseling: > three minutes) [address]406 Diagnosis Code: Tobacco Use Z72.0 Asymptomatic Patient no  Counseling  Former Smokers:  Discussed the importance of maintaining cigarette abstinence. N/A Diagnosis Code: Personal History of Nicotine  Dependence. S12.108 Information about tobacco cessation classes and interventions provided to patient. Yes Patient provided with ticket for LDCT Scan. N/A Written Order for Lung Cancer Screening with LDCT placed in Epic. Yes (CT Chest Lung Cancer Screening Low Dose W/O CM) [id number] Z12.2-Screening of respiratory organs Z87.891-Personal history of nicotine  dependence   [name] is a current user of tobacco or nicotine  products. He is ready to quit at this time. He is down to half a pack/day. Specifically discussed reducing to quit. Counseling provided today addressed the risks of continued use and the benefits of cessation. Discussed tobacco/nicotine  use history, readiness to quit, and evidence-based treatment options including behavioral strategies, support resources, and pharmacologic therapies. Provided encouragement and educational materials on steps and resources to quit smoking. Patient questions were addressed, and follow-up recommended for continued support. Total time spent on counseling: 4 minutes.   Shared decision visit completed by Wells Georgia, FNP as a registered nurse awaiting credentialing.    Wells CHRISTELLA Georgia, FNP

## 2024-09-12 ENCOUNTER — Ambulatory Visit (HOSPITAL_COMMUNITY)
Admission: RE | Admit: 2024-09-12 | Discharge: 2024-09-12 | Disposition: A | Discharge status: Home / Self Care | Payer: Self-pay | Source: Ambulatory Visit | Attending: Acute Care | Admitting: Acute Care

## 2024-09-12 DIAGNOSIS — F17210 Nicotine dependence, cigarettes, uncomplicated: Secondary | ICD-10-CM | POA: Insufficient documentation

## 2024-09-12 DIAGNOSIS — Z87891 Personal history of nicotine dependence: Secondary | ICD-10-CM | POA: Insufficient documentation

## 2024-09-12 DIAGNOSIS — Z122 Encounter for screening for malignant neoplasm of respiratory organs: Secondary | ICD-10-CM | POA: Insufficient documentation

## 2024-09-18 ENCOUNTER — Other Ambulatory Visit: Payer: Self-pay

## 2024-09-18 ENCOUNTER — Ambulatory Visit (INDEPENDENT_AMBULATORY_CARE_PROVIDER_SITE_OTHER): Payer: Self-pay

## 2024-09-18 VITALS — BP 128/80 | HR 93 | Ht 67.0 in | Wt 132.0 lb

## 2024-09-18 DIAGNOSIS — F17210 Nicotine dependence, cigarettes, uncomplicated: Secondary | ICD-10-CM

## 2024-09-18 DIAGNOSIS — Z1211 Encounter for screening for malignant neoplasm of colon: Secondary | ICD-10-CM

## 2024-09-18 DIAGNOSIS — E559 Vitamin D deficiency, unspecified: Secondary | ICD-10-CM

## 2024-09-18 DIAGNOSIS — R55 Syncope and collapse: Secondary | ICD-10-CM

## 2024-09-18 DIAGNOSIS — Z87891 Personal history of nicotine dependence: Secondary | ICD-10-CM

## 2024-09-18 DIAGNOSIS — I10 Essential (primary) hypertension: Secondary | ICD-10-CM

## 2024-09-18 DIAGNOSIS — Z122 Encounter for screening for malignant neoplasm of respiratory organs: Secondary | ICD-10-CM

## 2024-09-18 MED ORDER — AMLODIPINE-OLMESARTAN 10-40 MG PO TABS: 1.0000 | ORAL_TABLET | Freq: Every day | ORAL | 3 refills | Status: AC

## 2024-09-18 MED ORDER — VITAMIN D (ERGOCALCIFEROL) 1.25 MG (50000 UNIT) PO CAPS: 50000.0000 [IU] | ORAL_CAPSULE | ORAL | 3 refills | Status: AC

## 2024-09-18 NOTE — Progress Notes (Unsigned)
 "  Established Patient Office Visit  Subjective   Patient ID: Mike Keller, male    DOB: 1966-07-09  Age: 59 y.o. MRN: [id number]  Chief Complaint  Patient presents with   Medical Management of Chronic Issues    Four month follow up    passing out    Episodes of passing out     HPI Discussed the use of AI scribe software for clinical note transcription with the patient, who gave verbal consent to proceed.  History of Present Illness    Mike Keller is a 59 year old male who presents with episodes of passing out and weakness.  Syncope - Multiple episodes of passing out, most recently two weeks ago - Episodes occur while sitting - No recollection of the events upon waking - Episodes have occurred at home and once at work, resulting in being sent home  Generalized weakness and tremors - Significant weakness, especially in the legs - Legs tremble and ambulation is difficult - Requires assistance to climb stairs - Fears walking due to weakness - Whole body trembles at times  Electrolyte abnormalities and anemia - History of hyponatremia and slight anemia identified during emergency room visit in July  Coronary artery disease - Recent CT scan for lung cancer screening revealed coronary artery atherosclerosis  Medication management - Currently taking blood pressure medication and requires a refill - Previously prescribed vitamin D  once weekly     Patient Active Problem List   Diagnosis Date Noted   Syncope 09/19/2024   Atypical chest pain 03/16/2024   Alcohol intoxication 03/16/2024   Hyponatremia 03/15/2024   Vitamin D  deficiency 09/20/2023   Asthma 08/23/2023   Need for Tdap vaccination 08/23/2023   Need for zoster vaccination 08/23/2023   Pulmonary nodule 03/16/2021   Tobacco abuse 04/20/2017   Angioedema due to angiotensin converting enzyme inhibitor (ACE-I)    Essential hypertension     ROS    Objective:     BP 128/80 (BP Location: Left Arm, Patient  Position: Sitting, Cuff Size: Normal)   Pulse 93   Ht 5' 7 (1.702 m)   Wt 132 lb (59.9 kg)   SpO2 99%   BMI 20.67 kg/m  BP Readings from Last 3 Encounters:  09/18/24 128/80  05/24/24 128/84  04/11/24 120/72   Wt Readings from Last 3 Encounters:  09/18/24 132 lb (59.9 kg)  04/11/24 126 lb 1.3 oz (57.2 kg)  03/16/24 123 lb 14.4 oz (56.2 kg)     Physical Exam Vitals and nursing note reviewed.  Constitutional:      Appearance: Normal appearance.  HENT:     Head: Normocephalic.  Eyes:     Extraocular Movements: Extraocular movements intact.     Pupils: Pupils are equal, round, and reactive to light.  Cardiovascular:     Rate and Rhythm: Normal rate and regular rhythm.  Pulmonary:     Effort: Pulmonary effort is normal.     Breath sounds: Normal breath sounds.  Musculoskeletal:     Cervical back: Normal range of motion and neck supple.  Neurological:     Mental Status: He is alert and oriented to person, place, and time.  Psychiatric:        Mood and Affect: Mood normal.        Thought Content: Thought content normal.        The 10-year ASCVD risk score (Arnett DK, et al., 2019) is: 15.4%    Assessment & Plan:   Problem List Items  Addressed This Visit       Cardiovascular and Mediastinum   Essential hypertension   Hypertension is well-controlled with current dose of amlodipine -olmesartan  to 10-40 mg daily.  No medication changes made today.        Relevant Medications   amLODipine -olmesartan  (AZOR ) 10-40 MG tablet     Other   Vitamin D  deficiency   Previously managed with supplementation. Current levels need reassessment. - Ordered blood work for vitamin D  levels. - Continue weekly vitamin D  supplementation.      Relevant Medications   Vitamin D , Ergocalciferol , (DRISDOL ) 1.25 MG (50000 UNIT) CAPS capsule   Other Relevant Orders   Vitamin D  (25 hydroxy) (Completed)   Syncope   Recurrent syncope with weakness and tremors. Possible contributors  include low sodium, mild anemia, and coronary artery atherosclerosis. - Ordered blood work for sodium and anemia. - Continue cardiology follow-up on January 27th.      Relevant Orders   CMP14+EGFR (Completed)   CBC with Differential/Platelet (Completed)   Other Visit Diagnoses       Primary hypertension    -  Primary   Relevant Medications   amLODipine -olmesartan  (AZOR ) 10-40 MG tablet     Screening for colon cancer       Relevant Orders   Cologuard       Return for for recheck of symptoms.    Leita Longs, FNP  "

## 2024-09-19 DIAGNOSIS — R55 Syncope and collapse: Secondary | ICD-10-CM | POA: Insufficient documentation

## 2024-09-19 LAB — CMP14+EGFR
ALT: 23 IU/L (ref 0–44)
AST: 63 IU/L — ABNORMAL HIGH (ref 0–40)
Albumin: 4.6 g/dL (ref 3.8–4.9)
Alkaline Phosphatase: 59 IU/L (ref 47–123)
BUN/Creatinine Ratio: 10 (ref 9–20)
BUN: 7 mg/dL (ref 6–24)
Bilirubin Total: 0.4 mg/dL (ref 0.0–1.2)
CO2: 17 mmol/L — ABNORMAL LOW (ref 20–29)
Calcium: 9.5 mg/dL (ref 8.7–10.2)
Chloride: 103 mmol/L (ref 96–106)
Creatinine, Ser: 0.72 mg/dL — ABNORMAL LOW (ref 0.76–1.27)
Globulin, Total: 2.6 g/dL (ref 1.5–4.5)
Glucose: 78 mg/dL (ref 70–99)
Potassium: 4.1 mmol/L (ref 3.5–5.2)
Sodium: 140 mmol/L (ref 134–144)
Total Protein: 7.2 g/dL (ref 6.0–8.5)
eGFR: 106 mL/min/1.73

## 2024-09-19 LAB — CBC WITH DIFFERENTIAL/PLATELET
Basophils Absolute: 0.1 x10E3/uL (ref 0.0–0.2)
Basos: 1 %
EOS (ABSOLUTE): 0.2 x10E3/uL (ref 0.0–0.4)
Eos: 2 %
Hematocrit: 40.1 % (ref 37.5–51.0)
Hemoglobin: 13.4 g/dL (ref 13.0–17.7)
Immature Grans (Abs): 0 x10E3/uL (ref 0.0–0.1)
Immature Granulocytes: 0 %
Lymphocytes Absolute: 3.1 x10E3/uL (ref 0.7–3.1)
Lymphs: 33 %
MCH: 35.1 pg — ABNORMAL HIGH (ref 26.6–33.0)
MCHC: 33.4 g/dL (ref 31.5–35.7)
MCV: 105 fL — ABNORMAL HIGH (ref 79–97)
Monocytes Absolute: 1.1 x10E3/uL — ABNORMAL HIGH (ref 0.1–0.9)
Monocytes: 12 %
Neutrophils Absolute: 4.7 x10E3/uL (ref 1.4–7.0)
Neutrophils: 52 %
Platelets: 187 x10E3/uL (ref 150–450)
RBC: 3.82 x10E6/uL — ABNORMAL LOW (ref 4.14–5.80)
RDW: 13 % (ref 11.6–15.4)
WBC: 9.2 x10E3/uL (ref 3.4–10.8)

## 2024-09-19 LAB — VITAMIN D 25 HYDROXY (VIT D DEFICIENCY, FRACTURES): Vit D, 25-Hydroxy: 30.7 ng/mL (ref 30.0–100.0)

## 2024-09-19 NOTE — Assessment & Plan Note (Signed)
 Hypertension is well-controlled with current dose of amlodipine -olmesartan  to 10-40 mg daily.  No medication changes made today.

## 2024-09-19 NOTE — Assessment & Plan Note (Signed)
 Previously managed with supplementation. Current levels need reassessment. - Ordered blood work for vitamin D  levels. - Continue weekly vitamin D  supplementation.

## 2024-09-19 NOTE — Assessment & Plan Note (Signed)
 Recurrent syncope with weakness and tremors. Possible contributors include low sodium, mild anemia, and coronary artery atherosclerosis. - Ordered blood work for sodium and anemia. - Continue cardiology follow-up on January 27th.

## 2024-10-02 ENCOUNTER — Ambulatory Visit: Payer: Self-pay | Admitting: Cardiology

## 2025-03-18 ENCOUNTER — Ambulatory Visit: Payer: Self-pay
